# Patient Record
Sex: Female | Born: 1975 | Race: Black or African American | Hispanic: No | Marital: Married | Smoking: Current every day smoker
Health system: Southern US, Community
[De-identification: ages and names within clinical notes are randomized; demographics above are authoritative.]

## PROBLEM LIST (undated history)

## (undated) DIAGNOSIS — F419 Anxiety disorder, unspecified: Secondary | ICD-10-CM

## (undated) DIAGNOSIS — I10 Essential (primary) hypertension: Secondary | ICD-10-CM

## (undated) DIAGNOSIS — J45909 Unspecified asthma, uncomplicated: Secondary | ICD-10-CM

## (undated) DIAGNOSIS — R51 Headache: Secondary | ICD-10-CM

## (undated) DIAGNOSIS — M549 Dorsalgia, unspecified: Secondary | ICD-10-CM

## (undated) DIAGNOSIS — G5601 Carpal tunnel syndrome, right upper limb: Secondary | ICD-10-CM

## (undated) DIAGNOSIS — R519 Headache, unspecified: Secondary | ICD-10-CM

## (undated) DIAGNOSIS — J189 Pneumonia, unspecified organism: Secondary | ICD-10-CM

## (undated) DIAGNOSIS — M62838 Other muscle spasm: Secondary | ICD-10-CM

## (undated) DIAGNOSIS — N200 Calculus of kidney: Secondary | ICD-10-CM

## (undated) HISTORY — PX: HEMORRHOID SURGERY: SHX153

## (undated) HISTORY — PX: KNEE SURGERY: SHX244

---

## 2015-03-12 ENCOUNTER — Encounter (HOSPITAL_BASED_OUTPATIENT_CLINIC_OR_DEPARTMENT_OTHER): Payer: Self-pay | Admitting: Emergency Medicine

## 2015-03-12 ENCOUNTER — Emergency Department (HOSPITAL_BASED_OUTPATIENT_CLINIC_OR_DEPARTMENT_OTHER)
Admission: EM | Admit: 2015-03-12 | Discharge: 2015-03-12 | Disposition: A | Discharge status: Home / Self Care | Payer: Self-pay | Attending: Emergency Medicine | Admitting: Emergency Medicine

## 2015-03-12 DIAGNOSIS — R519 Headache, unspecified: Secondary | ICD-10-CM

## 2015-03-12 DIAGNOSIS — F17200 Nicotine dependence, unspecified, uncomplicated: Secondary | ICD-10-CM | POA: Insufficient documentation

## 2015-03-12 DIAGNOSIS — Z79899 Other long term (current) drug therapy: Secondary | ICD-10-CM | POA: Insufficient documentation

## 2015-03-12 DIAGNOSIS — R51 Headache: Secondary | ICD-10-CM | POA: Insufficient documentation

## 2015-03-12 DIAGNOSIS — J45909 Unspecified asthma, uncomplicated: Secondary | ICD-10-CM | POA: Insufficient documentation

## 2015-03-12 DIAGNOSIS — Z3202 Encounter for pregnancy test, result negative: Secondary | ICD-10-CM | POA: Insufficient documentation

## 2015-03-12 DIAGNOSIS — M62838 Other muscle spasm: Secondary | ICD-10-CM | POA: Insufficient documentation

## 2015-03-12 DIAGNOSIS — Z8679 Personal history of other diseases of the circulatory system: Secondary | ICD-10-CM | POA: Insufficient documentation

## 2015-03-12 HISTORY — DX: Unspecified asthma, uncomplicated: J45.909

## 2015-03-12 LAB — URINALYSIS, ROUTINE W REFLEX MICROSCOPIC
Bilirubin Urine: NEGATIVE
GLUCOSE, UA: NEGATIVE mg/dL
KETONES UR: NEGATIVE mg/dL
Leukocytes, UA: NEGATIVE
Nitrite: NEGATIVE
PH: 6.5 (ref 5.0–8.0)
PROTEIN: NEGATIVE mg/dL
Specific Gravity, Urine: 1.004 — ABNORMAL LOW (ref 1.005–1.030)

## 2015-03-12 LAB — PREGNANCY, URINE: Preg Test, Ur: NEGATIVE

## 2015-03-12 LAB — URINE MICROSCOPIC-ADD ON

## 2015-03-12 MED ORDER — KETOROLAC TROMETHAMINE 30 MG/ML IJ SOLN
30.0000 mg | Freq: Once | INTRAMUSCULAR | Status: AC
  Administered 2015-03-12: 30 mg via INTRAVENOUS
  Filled 2015-03-12: qty 1

## 2015-03-12 MED ORDER — DIAZEPAM 5 MG/ML IJ SOLN
10.0000 mg | Freq: Once | INTRAMUSCULAR | Status: DC
  Filled 2015-03-12: qty 2

## 2015-03-12 MED ORDER — KETOROLAC TROMETHAMINE 30 MG/ML IJ SOLN
30.0000 mg | Freq: Once | INTRAMUSCULAR | Status: DC
  Filled 2015-03-12: qty 1

## 2015-03-12 MED ORDER — KETOROLAC TROMETHAMINE 10 MG PO TABS: 10.0000 mg | ORAL_TABLET | Freq: Four times a day (QID) | ORAL | Status: DC | PRN

## 2015-03-12 MED ORDER — DIAZEPAM 5 MG PO TABS: 5.0000 mg | ORAL_TABLET | Freq: Two times a day (BID) | ORAL | Status: DC

## 2015-03-12 MED ORDER — DEXAMETHASONE SODIUM PHOSPHATE 10 MG/ML IJ SOLN
10.0000 mg | Freq: Once | INTRAMUSCULAR | Status: AC
  Administered 2015-03-12: 10 mg via INTRAVENOUS
  Filled 2015-03-12: qty 1

## 2015-03-12 MED ORDER — SODIUM CHLORIDE 0.9 % IV BOLUS (SEPSIS)
1000.0000 mL | Freq: Once | INTRAVENOUS | Status: AC
  Administered 2015-03-12: 1000 mL via INTRAVENOUS

## 2015-03-12 MED ORDER — DIAZEPAM 5 MG/ML IJ SOLN
5.0000 mg | Freq: Once | INTRAMUSCULAR | Status: AC
  Administered 2015-03-12: 5 mg via INTRAVENOUS

## 2015-03-12 NOTE — ED Notes (Signed)
Patient states that she has had a frontal Headache that radiates to her right side with ear pain and jaw pain for about 1 week. Denies any sensitive to Lights and sounds. Denies any N/V. The patient is tearful in triage. Denies any blurred vision.

## 2015-03-12 NOTE — ED Provider Notes (Signed)
CSN: [id number]     Arrival date & time 03/12/15  1650 History   First MD Initiated Contact with Patient 03/12/15 1811     Chief Complaint  Patient presents with  . Headache     (Consider location/radiation/quality/duration/timing/severity/associated sxs/prior Treatment) Patient is a 39 y.o. female presenting with headaches.  Headache Associated symptoms: neck pain    39 y.o. F with hx of asthma, presenting to the ED for headache for the past week.  She states headache is currently localized to the right side of her forehead, throbbing in nature.  She states after 2 days she began developing right sided neck pain and headache worsened.  States her neck feels "tense" and "tight".  She maintained full ROM of her neck.  Denies fever, chills, sweats, nausea, vomiting, dizziness, confusion, numbness, weakness, visual disturbance, or difficulty walking. No changes in speech. Patient is not currently on any type of anticoagulation. No history of TIA or stroke. She does have history of migraine headaches, has not had one in several years.  She has never been evaluated by neurology for her headaches.  Patient was evaluated last night at [facility] ED, husband states doctor only touched her left arm during exam and prescribed her motrin and flexeril which she has been taking without relief.  VSS.  Past Medical History  Diagnosis Date  . Asthma    History reviewed. No pertinent past surgical history. History reviewed. No pertinent family history. Social History  Substance Use Topics  . Smoking status: Current Every Day Smoker  . Smokeless tobacco: None  . Alcohol Use: No   OB History    No data available     Review of Systems  Musculoskeletal: Positive for neck pain.  Neurological: Positive for headaches.  All other systems reviewed and are negative.     Allergies  Morphine and related and Iodine  Home Medications   Prior to Admission medications   Medication Sig Start Date End  Date Taking? Authorizing Provider  albuterol (PROVENTIL HFA;VENTOLIN HFA) 108 (90 BASE) MCG/ACT inhaler Inhale into the lungs every 6 (six) hours as needed for wheezing or shortness of breath.   Yes Historical Provider, MD   BP 124/81 mmHg  Pulse 75  Temp(Src) 98.2 F (36.8 C) (Oral)  Resp 18  Ht  (1.575 m)  Wt 72.576 kg  BMI 29.26 kg/m2  SpO2 100%  LMP 03/05/2015   Physical Exam  Constitutional: She is oriented to person, place, and time. She appears well-developed and well-nourished. No distress.  tearful  HENT:  Head: Normocephalic and atraumatic.  Right Ear: Tympanic membrane and ear canal normal.  Left Ear: Tympanic membrane and ear canal normal.  Nose: Nose normal.  Mouth/Throat: Uvula is midline, oropharynx is clear and moist and mucous membranes are normal. No oropharyngeal exudate, posterior oropharyngeal edema, posterior oropharyngeal erythema or tonsillar abscesses.  No temporal tenderness  Eyes: Conjunctivae, EOM and lids are normal. Pupils are equal, round, and reactive to light.  Neck: Normal range of motion, full passive range of motion without pain and phonation normal. Neck supple. Muscular tenderness present. No spinous process tenderness present. No rigidity. No erythema and normal range of motion present.    TTP and spasm of right cervical paraspinal muscles; no rigidity; full ROM  Cardiovascular: Normal rate, regular rhythm and normal heart sounds.   Pulmonary/Chest: Effort normal and breath sounds normal. No respiratory distress. She has no wheezes. She has no rhonchi. She has no rales.  Abdominal: Soft. Bowel sounds  are normal. There is no tenderness. There is no guarding.  Musculoskeletal: Normal range of motion. She exhibits no edema.  Neurological: She is alert and oriented to person, place, and time.  AAOx3, answering questions appropriately; equal strength UE and LE bilaterally; CN grossly intact; moves all extremities appropriately without ataxia;  no focal neuro deficits or facial asymmetry appreciated  Skin: Skin is warm and dry. She is not diaphoretic.  Psychiatric: She has a normal mood and affect.  Nursing note and vitals reviewed.   ED Course  Procedures (including critical care time) Labs Review Labs Reviewed  URINALYSIS, ROUTINE W REFLEX MICROSCOPIC (NOT AT [facility]) - Abnormal; Notable for the following:    Specific Gravity, Urine 1.004 (*)    Hgb urine dipstick TRACE (*)    All other components within normal limits  URINE MICROSCOPIC-ADD ON - Abnormal; Notable for the following:    Squamous Epithelial / LPF 0-5 (*)    Bacteria, UA MANY (*)    All other components within normal limits  URINE CULTURE  PREGNANCY, URINE    Imaging Review No results found. I have personally reviewed and evaluated these images and lab results as part of my medical decision-making.   EKG Interpretation None      MDM   Final diagnoses:  Headache, unspecified headache type  Muscle spasms of neck   39 y.o. F here with headache and right sided neck pain.  Seen last night in [place] and given motrin and flexeril which she has taken without relief.  Patient afebrile, non-toxic.  She has muscle spasm of her right cervical paraspinal muscles are tender to palpation. She has no nuchal rigidity. Neurologic exam is nonfocal.  Patient does have history of migraines, states she has not had one in several years. Given her presentation today, feel this is more likely tension-type headache. Patient was treated with toradol, Valium, and Decadron with resolution of headache. She did have a slight recurrence of headache while in the ED, but states it was only 3/10.  She was again treated with Toradol with improvement. She request discharge home with same medications given here which I have written for. She was encouraged to follow-up with primary care physician.  Of note, u/a with many bacteria, however patient is asymptomatic of this.  Will hold abx  treatment pending urine culture.  Discussed plan with patient, he/she acknowledged understanding and agreed with plan of care.  Return precautions given for new or worsening symptoms.  Garlon HatchetLisa M Alan Riles, PA-C 03/12/15 2135  Rolan BuccoMelanie Belfi, MD 03/12/15 2249

## 2015-03-12 NOTE — ED Notes (Signed)
HA onset last week, progressively worse over time, went to [place] last PM with some complaint, 4 ibuprofen at noon today. Took a muscle relaxant approx 1 hour ago.

## 2015-03-12 NOTE — ED Notes (Signed)
Denies any nausea or vomiting 

## 2015-03-12 NOTE — ED Notes (Signed)
Pain is more localized to rt side of head above rt eye, radiates to rt side of neck

## 2015-03-12 NOTE — Discharge Instructions (Signed)
Take the prescribed medication as directed.  May take tylenol between doses if needed. Follow-up with your primary care physician as soon as possible. Return to the ED for new or worsening symptoms.   Tension Headache A tension headache is a feeling of pain, pressure, or aching that is often felt over the front and sides of the head. The pain can be dull, or it can feel tight (constricting). Tension headaches are not normally associated with nausea or vomiting, and they do not get worse with physical activity. Tension headaches can last from 30 minutes to several days. This is the most common type of headache. CAUSES The exact cause of this condition is not known. Tension headaches often begin after stress, anxiety, or depression. Other triggers may include:  Alcohol.  Too much caffeine, or caffeine withdrawal.  Respiratory infections, such as colds, flu, or sinus infections.  Dental problems or teeth clenching.  Fatigue  Holding your head and neck in the same position for a long period of time, such as while using a computer.  Smoking. SYMPTOMS Symptoms of this condition include:  A feeling of pressure around the head.  Dull, aching head pain.  Pain felt over the front and sides of the head.  Tenderness in the muscles of the head, neck, and shoulders. DIAGNOSIS This condition may be diagnosed based on your symptoms and a physical exam. Tests may be done, such as a CT scan or an MRI of your head. These tests may be done if your symptoms are severe or unusual. TREATMENT This condition may be treated with lifestyle changes and medicines to help relieve symptoms. HOME CARE INSTRUCTIONS Managing Pain  Take over-the-counter and prescription medicines only as told by your health care provider.  Lie down in a dark, quiet room when you have a headache.  If directed, apply ice to the head and neck area:  Put ice in a plastic bag.  Place a towel between your skin and the  bag.  Leave the ice on for 20 minutes, 2-3 times per day.  Use a heating pad or a hot shower to apply heat to the head and neck area as told by your health care provider. Eating and Drinking  Eat meals on a regular schedule.  Limit alcohol use.  Decrease your caffeine intake, or stop using caffeine. General Instructions  Keep all follow-up visits as told by your health care provider. This is important.  Keep a headache journal to help find out what may trigger your headaches. For example, write down:  What you eat and drink.  How much sleep you get.  Any change to your diet or medicines.  Try massage or other relaxation techniques.  Limit stress.  Sit up straight, and avoid tensing your muscles.  Do not use tobacco products, including cigarettes, chewing tobacco, or e-cigarettes. If you need help quitting, ask your health care provider.  Exercise regularly as told by your health care provider.  Get 7-9 hours of sleep, or the amount recommended by your health care provider. SEEK MEDICAL CARE IF:  Your symptoms are not helped by medicine.  You have a headache that is different from what you normally experience.  You have nausea or you vomit.  You have a fever. SEEK IMMEDIATE MEDICAL CARE IF:  Your headache becomes severe.  You have repeated vomiting.  You have a stiff neck.  You have a loss of vision.  You have problems with speech.  You have pain in your eye or  ear.  You have muscular weakness or loss of muscle control.  You lose your balance or you have trouble walking.  You feel faint or you pass out.  You have confusion.   This information is not intended to replace advice given to you by your health care provider. Make sure you discuss any questions you have with your health care provider.   Document Released: 03/12/2005 Document Revised: 12/01/2014 Document Reviewed: 07/05/2014 Elsevier Interactive Patient Education [employer]

## 2015-03-12 NOTE — ED Notes (Signed)
Informed PA-C of pts complaint and presentation, PA-C in to see pt

## 2015-03-12 NOTE — ED Notes (Signed)
Pt placed on cont POX monitoring with q6615min NBP assessments, callbell within reach, sr x 2 up, enviornmental changes made to promote comfort, pt reassured.

## 2015-03-14 LAB — URINE CULTURE

## 2015-03-15 ENCOUNTER — Telehealth (HOSPITAL_BASED_OUTPATIENT_CLINIC_OR_DEPARTMENT_OTHER): Payer: Self-pay | Admitting: Emergency Medicine

## 2015-03-15 NOTE — Telephone Encounter (Signed)
Post ED Visit - Positive Culture Follow-up  Culture report reviewed by antimicrobial stewardship pharmacist:  [x]  Terri Rios, Pharm.D. []  Terri Rios, Pharm.D., BCPS []  Terri Rios, Pharm.D. []  Terri Rios, Pharm.D., BCPS []  [place] Valley[place]h Rios, [address]Pharm[address]D., BCPS, AAHIVP []  Estella HuskMichelle Rios, Pharm.D., BCPS, AAHIVP []  [name], Pharm.D. []  Rob Terri Rios, [address]Ph[address]  Positive urine culture Klebsiella Treated with none, asymptomatic, and no further patient follow-up is required at this time.  Terri Rios, Terri Rios 03/15/2015, 9:29 AM

## 2015-03-15 NOTE — Progress Notes (Signed)
ED Antimicrobial Stewardship Positive Culture Follow Up   Collene MaresYolanda Berne is an 39 y.o. female who [facility] with a chief complaint of  Chief Complaint  Patient presents with  . Headache    Recent Results (from the past 720 hour(s))  Urine culture     Status: None   Collection Time: 03/12/15  5:53 PM  Result Value Ref Range Status   Specimen Description URINE, CLEAN CATCH  Final   Special Requests NONE  Final   Culture   Final    >=100,000 COLONIES/mL KLEBSIELLA PNEUMONIAE [facility]    Report Status 03/14/2015 FINAL  Final   Organism ID, Bacteria KLEBSIELLA PNEUMONIAE  Final      Susceptibility   Klebsiella pneumoniae - MIC*    AMPICILLIN >=32 RESISTANT Resistant     CEFAZOLIN <=4 SENSITIVE Sensitive     CEFTRIAXONE <=1 SENSITIVE Sensitive     CIPROFLOXACIN <=0.25 SENSITIVE Sensitive     GENTAMICIN <=1 SENSITIVE Sensitive     IMIPENEM <=0.25 SENSITIVE Sensitive     NITROFURANTOIN 64 INTERMEDIATE Intermediate     TRIMETH/SULFA <=20 SENSITIVE Sensitive     AMPICILLIN/SULBACTAM 4 SENSITIVE Sensitive     PIP/TAZO <=4 SENSITIVE Sensitive     * >=100,000 COLONIES/mL KLEBSIELLA PNEUMONIAE    Asymptomatic bacteruria  Plan: No treatment   ED Provider: Fuller SongSerena Sam PA-C   Justyna Timoney J 03/15/2015, 8:56 AM Infectious Diseases Pharmacist Phone# [phone]

## 2015-03-29 ENCOUNTER — Encounter (HOSPITAL_BASED_OUTPATIENT_CLINIC_OR_DEPARTMENT_OTHER): Payer: Self-pay | Admitting: *Deleted

## 2015-03-29 ENCOUNTER — Emergency Department (HOSPITAL_BASED_OUTPATIENT_CLINIC_OR_DEPARTMENT_OTHER): Payer: Self-pay

## 2015-03-29 ENCOUNTER — Emergency Department (HOSPITAL_BASED_OUTPATIENT_CLINIC_OR_DEPARTMENT_OTHER)
Admission: EM | Admit: 2015-03-29 | Discharge: 2015-03-29 | Disposition: A | Discharge status: Home / Self Care | Payer: Self-pay | Attending: Emergency Medicine | Admitting: Emergency Medicine

## 2015-03-29 DIAGNOSIS — Z3202 Encounter for pregnancy test, result negative: Secondary | ICD-10-CM | POA: Insufficient documentation

## 2015-03-29 DIAGNOSIS — K047 Periapical abscess without sinus: Secondary | ICD-10-CM | POA: Insufficient documentation

## 2015-03-29 DIAGNOSIS — F17200 Nicotine dependence, unspecified, uncomplicated: Secondary | ICD-10-CM | POA: Insufficient documentation

## 2015-03-29 DIAGNOSIS — M25511 Pain in right shoulder: Secondary | ICD-10-CM | POA: Insufficient documentation

## 2015-03-29 DIAGNOSIS — K029 Dental caries, unspecified: Secondary | ICD-10-CM | POA: Insufficient documentation

## 2015-03-29 DIAGNOSIS — M62838 Other muscle spasm: Secondary | ICD-10-CM | POA: Insufficient documentation

## 2015-03-29 DIAGNOSIS — J45909 Unspecified asthma, uncomplicated: Secondary | ICD-10-CM | POA: Insufficient documentation

## 2015-03-29 DIAGNOSIS — Z79899 Other long term (current) drug therapy: Secondary | ICD-10-CM | POA: Insufficient documentation

## 2015-03-29 DIAGNOSIS — H9209 Otalgia, unspecified ear: Secondary | ICD-10-CM | POA: Insufficient documentation

## 2015-03-29 LAB — PREGNANCY, URINE: Preg Test, Ur: NEGATIVE

## 2015-03-29 MED ORDER — DIPHENHYDRAMINE HCL 50 MG/ML IJ SOLN
25.0000 mg | Freq: Once | INTRAMUSCULAR | Status: AC
Start: 2015-03-29 — End: 2015-03-29
  Administered 2015-03-29: 25 mg via INTRAVENOUS
  Filled 2015-03-29: qty 1

## 2015-03-29 MED ORDER — PROCHLORPERAZINE EDISYLATE 5 MG/ML IJ SOLN
10.0000 mg | Freq: Four times a day (QID) | INTRAMUSCULAR | Status: DC | PRN
  Administered 2015-03-29: 10 mg via INTRAVENOUS
  Filled 2015-03-29: qty 2

## 2015-03-29 MED ORDER — AMOXICILLIN-POT CLAVULANATE 875-125 MG PO TABS: 1.0000 | ORAL_TABLET | Freq: Two times a day (BID) | ORAL | Status: DC

## 2015-03-29 MED ORDER — DIAZEPAM 5 MG/ML IJ SOLN
5.0000 mg | Freq: Once | INTRAMUSCULAR | Status: AC
  Administered 2015-03-29: 5 mg via INTRAVENOUS
  Filled 2015-03-29: qty 2

## 2015-03-29 MED ORDER — DIAZEPAM 5 MG PO TABS: 5.0000 mg | ORAL_TABLET | Freq: Two times a day (BID) | ORAL | Status: DC | PRN

## 2015-03-29 MED FILL — diazePAM 5 MG TABS: 5 | 5 days supply | Qty: 10 | Fill #0

## 2015-03-29 MED FILL — AMOX-CLAV 875-125 MG TABLET: 875-125 | 7 days supply | Qty: 14 | Fill #0

## 2015-03-29 NOTE — Discharge Instructions (Signed)
Dental Abscess °A dental abscess is a collection of pus in or around a tooth. °CAUSES °This condition is caused by a bacterial infection around the root of the tooth that involves the inner part of the tooth (pulp). It may result from: °· Severe tooth decay. °· Trauma to the tooth that allows bacteria to enter into the pulp, such as a broken or chipped tooth. °· Severe gum disease around a tooth. °SYMPTOMS °Symptoms of this condition include: °· Severe pain in and around the infected tooth. °· Swelling and redness around the infected tooth, in the mouth, or in the face. °· Tenderness. °· Pus drainage. °· Bad breath. °· Bitter taste in the mouth. °· Difficulty swallowing. °· Difficulty opening the mouth. °· Nausea. °· Vomiting. °· Chills. °· Swollen neck glands. °· Fever. °DIAGNOSIS °This condition is diagnosed with examination of the infected tooth. During the exam, your dentist may tap on the infected tooth. Your dentist will also ask about your medical and dental history and may order X-rays. °TREATMENT °This condition is treated by eliminating the infection. This may be done with: °· Antibiotic medicine. °· A root canal. This may be performed to save the tooth. °· Pulling (extracting) the tooth. This may also involve draining the abscess. This is done if the tooth cannot be saved. °HOME CARE INSTRUCTIONS °· Take medicines only as directed by your dentist. °· If you were prescribed antibiotic medicine, finish all of it even if you start to feel better. °· Rinse your mouth (gargle) often with salt water to relieve pain or swelling. °· Do not drive or operate heavy machinery while taking pain medicine. °· Do not apply heat to the outside of your mouth. °· Keep all follow-up visits as directed by your dentist. This is important. °SEEK MEDICAL CARE IF: °· Your pain is worse and is not helped by medicine. °SEEK IMMEDIATE MEDICAL CARE IF: °· You have a fever or chills. °· Your symptoms suddenly get worse. °· You have a  very bad headache. °· You have problems breathing or swallowing. °· You have trouble opening your mouth. °· You have swelling in your neck or around your eye. °  °This information is not intended to replace advice given to you by your health care provider. Make sure you discuss any questions you have with your health care provider. °  °Document Released: 03/12/2005 Document Revised: 07/27/2014 Document Reviewed: 03/09/2014 °Elsevier Interactive Patient Education ©2016 Elsevier Inc. ° °Emergency Department Resource Guide °1) Find a Doctor and Pay Out of Pocket °Although you won't have to find out who is covered by your insurance plan, it is a good idea to ask around and get recommendations. You will then need to call the office and see if the doctor you have chosen will accept you as a new patient and what types of options they offer for patients who are self-pay. Some doctors offer discounts or will set up payment plans for their patients who do not have insurance, but you will need to ask so you aren't surprised when you get to your appointment. ° °2) Contact Your Local Health Department °Not all health departments have doctors that can see patients for sick visits, but many do, so it is worth a call to see if yours does. If you don't know where your local health department is, you can check in your phone book. The CDC also has a tool to help you locate your state's health department, and many state websites also have listings   of all of their local health departments. ° °3) Find a Walk-in Clinic °If your illness is not likely to be very severe or complicated, you may want to try a walk in clinic. These are popping up all over the country in pharmacies, drugstores, and shopping centers. They're usually staffed by nurse practitioners or physician assistants that have been trained to treat common illnesses and complaints. They're usually fairly quick and inexpensive. However, if you have serious medical issues or  chronic medical problems, these are probably not your best option. ° °No Primary Care Doctor: °- Call [facility] at  832-8000 - they can help you locate a primary care doctor that  accepts your insurance, provides certain services, etc. °- Physician Referral Service- 1-800-533-3463 ° °Chronic Pain Problems: °Organization         Address  Phone   Notes  ° [facility]  (336) 297-2271 Patients need to be referred by their primary care doctor.  ° °Medication Assistance: °Organization         Address  Phone   Notes  °[place] [facility] [address] °[place], [place] [address] (336) 641-8030 --Must be a resident of [place] °-- Must have NO insurance coverage whatsoever (no Medicaid/ Medicare, etc.) °-- The pt. MUST have a primary care doctor that directs their care regularly and follows them in the community °  °[facility]  (866) 331-1348   °[employer]  (888) 892-1162   ° °Agencies that provide inexpensive medical care: °Organization         Address  Phone   Notes  °[facility]  (336) 832-8035   °[facility]    (336) 832-7272   °[facility] [address] °[place], [place] [address] (336) 832-4777   °[facility] [address], °[place] (336) 271-4999   °[facility]    (336) 373-0678   °[facility]    (336) 272-1050   °[facility] ° [address], [place] Phone:  (336) 832-4444, Fax:  (336) 832-4440 Hours of Operation:  9 am - 6 pm, M-F.  Also accepts Medicaid/Medicare and self-pay.  °[facility] ° [address], [place] Phone: (336) 832-3150, Fax: (336) 832-3151. Hours of Operation:  8:30 am - 5:30 pm, M-F.  Also accepts Medicaid and self-pay.  °[facility] [place] [address], [place] Phone: (336) 878-6027   °[facility] [address], [place],   (336)723-1848, Ext. 123 Mondays & Thursdays: 7-9 AM.  First 15 patients are seen on a first come, first serve basis. °  ° °Medicaid-accepting [place] Providers: ° °Organization         Address  Phone   Notes  °[facility] [address], [place] (336) 641-2100 Also accepts self-pay patients.  °[facility] [address], [place] ° (336) 856-9996   °[facility] [address], [place] (336) 288-8857   °[facility] [address] [place] [address],  (336) 299-7000   °Ve[facility] [address],   ° (336) 373-1557 Only accepts [place] Access Medicaid patients after they have their name applied to their card.  ° °Self-Pay (no insurance) in [place]: ° °Organization         Address  Phone   Notes  °  Sickle Cell Patients, [facility] [address], [place] (336) 832-1970   °[facility] [address], [place] (336) 832-4400   °[facility] ° [address],  (336) 992-4800   °[facility]/Dr. Osei-Bonsu ° [address] [place] [address], [place] or [address], [place] (336) 841-8500 Phone number for both [place] and [place] locations is the same.  °[facility] [address], [place] (336) 299-0000   °[facility] [address] [place] [address], [place] or [address] (336) 852-7530 °(336) 878-2260   °[facility] [address], [place] (336) 350-1642, phone; (336) 294-5005, fax Sees patients 1st and 3rd Saturday of every month.  Must not qualify for public or private insurance (i.e. Medicaid, Medicare, [place] Health Choice, Veterans' Benefits) • Household income should be no more than 200% of the poverty level •The clinic cannot treat you if you are pregnant or think you are pregnant • Sexually transmitted  diseases are not treated at the clinic.  ° ° °Dental Care: °Organization         Address  Phone  Notes  °[facility] [address], [place] (336) 641-6152 Accepts children up to age 21 who are enrolled in Medicaid or [place] Health Choice; pregnant women with a Medicaid card; and children who have applied for Medicaid or [place] Health Choice, but were declined, whose parents can pay a reduced fee at time of service.  °[facility] [place]  [address], [place] (336) 641-7733 Accepts children up to age 21 who are enrolled in Medicaid or [place] Health Choice; pregnant women with a Medicaid card; and children who have applied for Medicaid or [place] Health Choice, but were declined, whose parents can pay a reduced fee at time of service.  °[facility] ° [address],  (336) 641-4533 Patients are seen by appointment only. Walk-ins are not accepted. [facility] will see patients 18 years of age and older. °Monday - Tuesday (8am-5pm) °Most Wednesdays (8:30-5pm) °$30 per visit, cash only  °[facility] ° [address], [place] (336) 641-4533 Patients are seen by appointment only. Walk-ins are not accepted. [facility] will see patients 18 years of age and older. °One Wednesday Evening (Monthly: Volunteer Based).  $30 per visit, cash only  °[facility]  (919) 537-3737 for adults; Children under age 4, call Graduate Pediatric Dentistry at ([phone]) [phone]. Children aged 4-14, please call (919) [phone] to request a pediatric application. ° Dental services are provided in all areas of dental care including fillings, crowns and bridges, complete and partial dentures, implants, gum treatment, root canals, and extractions. Preventive care is also provided. Treatment is provided to both adults and children. °Patients are selected via a  lottery and there is often a waiting list. °  °[facility] [address], ° ° (336) 763-8833 [email or web address] °  °[facility] [address], [place], [place] (336)723-1848, Ext. 123 Second and Fourth Thursday of each month, opens at 6:30 AM; Clinic ends at 9 AM.  Patients are seen on a first-come first-served basis, and a limited number are seen during each clinic.  ° °[facility] ° [address], [place], [place] (336) 723-7904   Eligibility   Requirements °You must have lived in [place], or [place] counties for at least the last three months. °  You cannot be eligible for state or federal sponsored healthcare insurance, including Veterans Administration, Medicaid, or Medicare. °  You generally cannot be eligible for healthcare insurance through your employer.  °  How to apply: °Eligibility screenings are held every Tuesday and Wednesday afternoon from 1:00 pm until 4:00 pm. You do not need an appointment for the interview!  °[facility] [address], [place], [place] 336-631-2330   °[facility]  336-342-8273   °[facility]  336-703-3100   °[facility]  336-570-6415   ° °Behavioral Health Resources in the Community: °Intensive Outpatient Programs °Organization         Address  Phone  Notes  °[facility] [address], [place], [place] 336-878-6098   °[facility] [address], [place], [place] 336-832-9800   °[facility] [address], [place], [place] ° 336-882-2125   °[facility] [address],  °[place], [place] 1-800-853-5163 or 336-641-4981   °Substance Abuse Resources °Organization         Address  Phone  Notes  °[facility]  336-882-2125   °[facility]  336-784-9470   °[facility]  336-285-9073   °[facility]  336-845-3988   °Residential &  Outpatient Substance Abuse Program  1-800-659-3381   °Psychological Services °Organization         Address  Phone  Notes  °[facility]  336- 832-9600   °[facility]  336- 378-7881   °[facility] [address], [place] 1-800-853-5163 or 336-641-4981   ° °Mobile Crisis Teams °Organization         Address  Phone  Notes  °[facility], Mobile Crisis Care Unit  1-877-626-1772   °Assertive °[facility] ° [address] [place], [place] 336-834-9664   °Sharon DeEsch [address] °[place] 336-554-5454   ° °Self-Help/Support Groups °Organization         Address  Phone             Notes  °[facility] [place] - variety of support groups  336- 373-1402 Call for more information  °Narcotics Anonymous (NA), [facility] [address], °[place]  2 meetings at this location  ° °Residential Treatment Programs °Organization         Address  Phone  Notes  °[facility] [address],    °[place]  1-866-801-8205   °[facility] ° [address], [place], [place] 704-293-8524   °[facility] [address], [place] 336-845-3988 Admissions: 8am-3pm M-F  °[facility] [address]    °[place], [place] 336-841-1104   °[facility] [address], [place], [place] 336-379-7146   °[facility] [address]  °[place], [place] 336-285-9073   °[facility] Intensive Outpatient [address], [place], [place] 336-852-3033   °[facility] [address]  °[place], [place] 1-877-615-2722 or 336-784-9470   °[facility] [address] [place], [place] 336-227-7417 Accepts Medicaid  °[facility] [address]  °[place]  1-800-659-3381 Substance Abuse/Addiction Treatment  ° °[place] Behavioral Health Resources °Organization            Address  Phone  Notes  °[facility]  (888) 581-9988   °Julie Brannon, PhD [address] [place], [place]   (336) 349-5553 or (336) 951-0000   °[facility]   [address] °[place], [place] (336) 349-4454   °[facility] [address], [place], [place] (336) 342-8316 Insurance/Medicaid/sponsorship through [facility]  °[facility] [address]                                    [place], [place] (336) 342-8316 Therapy/tele-psych/case  °[facility] [address]  ° [place], [place] (336) 349-2233    °Dr. Arfeen  (336) 349-4544   °[facility] [place]  [employer] [place] [facility] 1) [address], [place] °2) [address], [place] °3)  [address], [place] (336) 349-3220 °(336) 342-7768 ° °(336) 342-8140   °[place] [facility] (336) 342-1394 or (336) 342-3537 (After Hours)    ° ° °

## 2015-03-29 NOTE — ED Provider Notes (Signed)
CSN: [id number]     Arrival date & time 03/29/15  [id number] History   First MD Initiated Contact with Patient 03/29/15 [id number]     Chief Complaint  Patient presents with  . Headache     (Consider location/radiation/quality/duration/timing/severity/associated sxs/prior Treatment) Patient is a 40 y.o. female presenting with headaches.  Headache Pain location:  R temporal and R parietal Quality:  Sharp Radiates to:  R neck and R shoulder (right side of head) Severity currently:  10/10 Severity at highest:  10/10 Onset quality:  Gradual Duration:  5 days Timing:  Constant Progression:  Worsening Chronicity:  New Similar to prior headaches: yes (had episode prior 12/17)   Relieved by:  Nothing Worsened by:  Light and neck movement Ineffective treatments:  Acetaminophen (heating pad, muscle relaxant) Associated symptoms: ear pain and neck pain   Associated symptoms: no abdominal pain, no back pain, no blurred vision, no cough, no fatigue, no fever, no focal weakness, no nausea, no near-syncope, no seizures, no sore throat, no syncope, no URI, no vomiting and no weakness   Associated symptoms comment:  Jaw pain Risk factors: no family hx of SAH     Past Medical History  Diagnosis Date  . Asthma    History reviewed. No pertinent past surgical history. History reviewed. No pertinent family history. Social History  Substance Use Topics  . Smoking status: Current Every Day Smoker  . Smokeless tobacco: None  . Alcohol Use: No   OB History    No data available     Review of Systems  Constitutional: Negative for fever and fatigue.  HENT: Positive for ear pain. Negative for sore throat.   Eyes: Negative for blurred vision and visual disturbance.  Respiratory: Negative for cough and shortness of breath.   Cardiovascular: Negative for chest pain, syncope and near-syncope.  Gastrointestinal: Negative for nausea, vomiting and abdominal pain.  Genitourinary: Negative for difficulty  urinating.  Musculoskeletal: Positive for neck pain. Negative for back pain.  Skin: Negative for rash.  Neurological: Positive for headaches. Negative for focal weakness, seizures, syncope and weakness.      Allergies  Morphine and related and Iodine  Home Medications   Prior to Admission medications   Medication Sig Start Date End Date Taking? Authorizing Provider  albuterol (PROVENTIL HFA;VENTOLIN HFA) 108 (90 BASE) MCG/ACT inhaler Inhale into the lungs every 6 (six) hours as needed for wheezing or shortness of breath.    Historical Provider, MD  amoxicillin-clavulanate (AUGMENTIN) 875-125 MG tablet Take 1 tablet by mouth every 12 (twelve) hours. 03/29/15 04/05/15  Alvira MondayErin Inri Sobieski, MD  diazepam (VALIUM) 5 MG tablet Take 1 tablet (5 mg total) by mouth every 12 (twelve) hours as needed for anxiety or muscle spasms. 03/29/15   Alvira MondayErin Tamsen Reist, MD   BP 120/84 mmHg  Pulse 74  Temp(Src) 98.8 F (37.1 C) (Oral)  Resp 16  SpO2 100%  LMP 03/28/2015 Physical Exam  Constitutional: She is oriented to person, place, and time. She appears well-developed and well-nourished. No distress.  HENT:  Head: Normocephalic and atraumatic.  Mouth/Throat: Mucous membranes are normal. Mucous membranes are not dry. No oral lesions. There is trismus (mild) in the jaw. Dental caries present. No tonsillar abscesses.  Eyes: Conjunctivae and EOM are normal.  Neck: Normal range of motion.  No neck swelling +tenderness and spasm of right trapezius  Cardiovascular: Normal rate, regular rhythm, normal heart sounds and intact distal pulses.  Exam reveals no gallop and no friction rub.   No murmur heard.  Pulmonary/Chest: Effort normal and breath sounds normal. No respiratory distress. She has no wheezes. She has no rales.  Abdominal: Soft. She exhibits no distension. There is no tenderness. There is no guarding.  Musculoskeletal: She exhibits no edema or tenderness.  Neurological: She is alert and oriented to person,  place, and time. She has normal strength. No cranial nerve deficit or sensory deficit. She displays a negative Romberg sign. Coordination normal. GCS eye subscore is 4. GCS verbal subscore is 5. GCS motor subscore is 6.  Skin: Skin is warm and dry. No rash noted. She is not diaphoretic. No erythema.  Nursing note and vitals reviewed.   ED Course  Procedures (including critical care time) Labs Review Labs Reviewed  PREGNANCY, URINE    Imaging Review Ct Head Wo Contrast  03/29/2015  CLINICAL DATA:  Headache with tenderness over right mastoid region. EXAM: CT HEAD WITHOUT CONTRAST TECHNIQUE: Contiguous axial images were obtained from the base of the skull through the vertex without intravenous contrast. COMPARISON:  None. FINDINGS: The ventricles are normal in size and configuration. There does appear to be atrophy in the high frontal lobe regions bilaterally. There is no intracranial mass, hemorrhage, extra-axial fluid collection, or midline shift. The gray-white compartments are normal. Bony calvarium appears intact. The mastoids on the left are clear. 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 No intraorbital lesions are identified. There is mild mucosal thickening in the anterior ethmoid air cells bilaterally. IMPRESSION: High frontal atrophy bilaterally. Ventricles are normal in size and configuration. No intracranial mass, hemorrhage, focal gray - white compartment lesion. 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 There is mucosal thickening in several anterior ethmoid air cells bilaterally. Mastoids on the left are clear. Electronically Signed   By: Bretta Bang III M.D.   On: 03/29/2015 09:55   Ct Soft Tissue Neck Wo Contrast  03/29/2015  CLINICAL DATA:  Right-sided head and neck pain. Jaw pain with trismus. EXAM: CT NECK WITHOUT CONTRAST TECHNIQUE: Multidetector CT imaging of the neck was performed following the standard protocol without intravenous contrast. Patient has history of  contrast allergy. A cauda COMPARISON:  None. FINDINGS: Evaluation of the soft tissues is suboptimal in the absence of IV contrast. There is significant periodontal disease affecting the mandibular teeth. Multiple teeth are missing, presumably extracted for periodontal disease. There is an oral cavity abscess adjacent to the medial angle of mandible on the RIGHT, 8 x 11 mm cross-section with a small bubble of air. New new see for instance image 44 series 4. No definite extension to the submandibular or sublingual space but characterization is made difficult in the absence of IV contrast. Reactive BILATERAL RIGHT greater than LEFT level 1 and level 2 lymph nodes. No mandibular osteomyelitis is detected. No acute sinus disease. Negative orbits. Airway midline. Negative visualized intracranial compartment. No lung apex lesion.  No neck masses. IMPRESSION: BILATERAL mandibular periodontal disease with a suspected RIGHT oral cavity abscess 8 x 11 mm cross-section, adjacent to the medial angle of mandible. Reactive cervical adenopathy. Within limits for assessment on noncontrast exam, no definite extension to the submandibular or sublingual space on the RIGHT. Recommend correlation with physical exam. Oral surgical or ENT consultation may be warranted. Electronically Signed   By: Elsie Stain M.D.   On: 03/29/2015 10:08   I have personally reviewed and evaluated these images and lab results as part of my medical decision-making.   EKG Interpretation None      MDM   Final diagnoses:  Periapical abscess, right cavity abscess near angle of mandible  Muscle spasms of neck   46 old female with history of asthma presents with severe right jaw/neck pain and right headache.  CT obtained (noncontrast given iodine allergy) showing right oral cavity abscess 8 x 11 mm adjacent to the medial angle of the mandible. There is no sign of sublingual swelling on exam and doubt Ludwig's angina.  Patient has tenderness over the  muscles on the right side of her neck, however does not have any significant right neck swelling and at this time doubt Lemierre's syndrome.  Patient likely with both muscular spasm and pain from dental abscess.  Gave prescription for Augmentin, and Valium for muscle spasm. Patient received headache cocktail and Valium in the emergency department with improvement of pain.  Discussed reasons to return to the emergency department in detail, including oral swelling, fevers, swelling the right side of the neck or other concerns. Patient discharged in stable condition with understanding of reasons to return.   Alvira Monday, MD 03/29/15 [phone]

## 2015-03-29 NOTE — ED Notes (Signed)
Pt amb to room 7 with slow steady gait, crying. Pt reports right sided head "pressure" around her left eye x 6 days. Pt states she has been seen here for same, and was given headache cocktail with relief.

## 2015-03-30 ENCOUNTER — Encounter (HOSPITAL_BASED_OUTPATIENT_CLINIC_OR_DEPARTMENT_OTHER): Payer: Self-pay | Admitting: Emergency Medicine

## 2015-03-30 ENCOUNTER — Inpatient Hospital Stay (HOSPITAL_BASED_OUTPATIENT_CLINIC_OR_DEPARTMENT_OTHER)
Admission: EM | Admit: 2015-03-30 | Discharge: 2015-04-01 | DRG: 158 | Disposition: A | Discharge status: Home / Self Care | Payer: BLUE CROSS/BLUE SHIELD | Attending: Internal Medicine | Admitting: Internal Medicine

## 2015-03-30 DIAGNOSIS — K045 Chronic apical periodontitis: Secondary | ICD-10-CM | POA: Diagnosis present

## 2015-03-30 DIAGNOSIS — K122 Cellulitis and abscess of mouth: Secondary | ICD-10-CM | POA: Diagnosis not present

## 2015-03-30 DIAGNOSIS — F17200 Nicotine dependence, unspecified, uncomplicated: Secondary | ICD-10-CM | POA: Diagnosis present

## 2015-03-30 DIAGNOSIS — K0530 Chronic periodontitis, unspecified: Secondary | ICD-10-CM | POA: Diagnosis present

## 2015-03-30 DIAGNOSIS — J45909 Unspecified asthma, uncomplicated: Secondary | ICD-10-CM | POA: Diagnosis present

## 2015-03-30 DIAGNOSIS — K0889 Other specified disorders of teeth and supporting structures: Secondary | ICD-10-CM

## 2015-03-30 DIAGNOSIS — K0401 Reversible pulpitis: Secondary | ICD-10-CM | POA: Diagnosis present

## 2015-03-30 DIAGNOSIS — Z79899 Other long term (current) drug therapy: Secondary | ICD-10-CM

## 2015-03-30 DIAGNOSIS — M542 Cervicalgia: Secondary | ICD-10-CM | POA: Diagnosis present

## 2015-03-30 DIAGNOSIS — M264 Malocclusion, unspecified: Secondary | ICD-10-CM | POA: Diagnosis present

## 2015-03-30 DIAGNOSIS — K029 Dental caries, unspecified: Secondary | ICD-10-CM | POA: Diagnosis present

## 2015-03-30 DIAGNOSIS — K060 Gingival recession: Secondary | ICD-10-CM | POA: Diagnosis present

## 2015-03-30 DIAGNOSIS — K036 Deposits [accretions] on teeth: Secondary | ICD-10-CM | POA: Diagnosis present

## 2015-03-30 LAB — BASIC METABOLIC PANEL WITH GFR
Anion gap: 6 (ref 5–15)
BUN: 9 mg/dL (ref 6–20)
CO2: 24 mmol/L (ref 22–32)
Calcium: 9.1 mg/dL (ref 8.9–10.3)
Chloride: 106 mmol/L (ref 101–111)
Creatinine, Ser: 0.7 mg/dL (ref 0.44–1.00)
GFR calc Af Amer: >60 mL/min
GFR calc non Af Amer: >60 mL/min
Glucose, Bld: 111 mg/dL — ABNORMAL HIGH (ref 65–99)
Potassium: 3.7 mmol/L (ref 3.5–5.1)
Sodium: 136 mmol/L (ref 135–145)

## 2015-03-30 LAB — CBC WITH DIFFERENTIAL/PLATELET
Basophils Absolute: 0 K/uL (ref 0.0–0.1)
Basophils Relative: 0 %
Eosinophils Absolute: 0.3 K/uL (ref 0.0–0.7)
Eosinophils Relative: 4 %
HCT: 37.4 % (ref 36.0–46.0)
Hemoglobin: 12.5 g/dL (ref 12.0–15.0)
Lymphocytes Relative: 33 %
Lymphs Abs: 2.3 K/uL (ref 0.7–4.0)
MCH: 30.1 pg (ref 26.0–34.0)
MCHC: 33.4 g/dL (ref 30.0–36.0)
MCV: 90.1 fL (ref 78.0–100.0)
Monocytes Absolute: 0.6 K/uL (ref 0.1–1.0)
Monocytes Relative: 9 %
Neutro Abs: 3.8 K/uL (ref 1.7–7.7)
Neutrophils Relative %: 54 %
Platelets: 309 K/uL (ref 150–400)
RBC: 4.15 MIL/uL (ref 3.87–5.11)
RDW: 12.7 % (ref 11.5–15.5)
WBC: 7.1 K/uL (ref 4.0–10.5)

## 2015-03-30 MED ORDER — HYDROMORPHONE HCL 1 MG/ML IJ SOLN
1.0000 mg | Freq: Once | INTRAMUSCULAR | Status: AC
  Administered 2015-03-30: 1 mg via INTRAVENOUS
  Filled 2015-03-30: qty 1

## 2015-03-30 MED ORDER — DIAZEPAM 5 MG/ML IJ SOLN
5.0000 mg | Freq: Once | INTRAMUSCULAR | Status: AC
  Administered 2015-03-30: 5 mg via INTRAVENOUS
  Filled 2015-03-30: qty 2

## 2015-03-30 NOTE — ED Notes (Signed)
C/o rt jaw pain x 2 weeks  Was seen here yesterday for same

## 2015-03-30 NOTE — Progress Notes (Signed)
40 year old F previously healthy presents with neck pain of several months duration.  Here today with severe controlled pain.    Admit to med surg for observation and pain control.

## 2015-03-30 NOTE — ED Notes (Signed)
Patient was here yesterday for the same. The patient reports that she is still in pain

## 2015-03-30 NOTE — ED Provider Notes (Signed)
CSN: [id number]     Arrival date & time 03/30/15  1948 History  By signing my name below, I, Jarvis Morganaylor Ferguson, attest that this documentation has been prepared under the direction and in the presence of Alberteen Samhristopher P Danford, MD. Electronically Signed: Jarvis Morganaylor Ferguson, ED Scribe. 03/31/2015. 12:30 AM.    Chief Complaint  Patient presents with  . Jaw Pain   The history is provided by the patient. No language interpreter was used.    HPI Comments: Terri Rios is a 40 y.o. female who presents to the Emergency Department complaining of constant, moderate, sharp, right temporal and right parietal HA. She states the pain radiates down into the right side of her neck, right jaw and right shoulder. She reports associated photophobia and intermittent, mild numbness in her right arm. Pt was seen here yesterday for the same with no relief in pain. She was prescribed Augmentin and Valium which she has been taking with no relief. Pt has a head and neck CT done yesterday which showed right oral cavity abscess but no other acute findings. Pt endorses the pain is worse with rotating her neck. She notes she has had neck problems in the past but never this severe. She denies any h/o neck surgery or MRIs. Pt states she has f/u to multiple hospitals for this problem with no conclusive answer; she notes she has been dx with muscle spasm, TMJ and pinched nerve to neck. Pt is a non smoker. She denies any h/o IV drug use. She denies any abdominal pain, back pain, fevers, chills, cough, fatigue, blurred vision, nausea, vomiting, or weakness.   Past Medical History  Diagnosis Date  . Asthma    History reviewed. No pertinent past surgical history. History reviewed. No pertinent family history. Social History  Substance Use Topics  . Smoking status: Current Every Day Smoker  . Smokeless tobacco: None  . Alcohol Use: No   OB History    No data available     Review of Systems  Constitutional: Negative for fever,  chills and fatigue.  HENT: Positive for ear pain.   Eyes: Positive for photophobia. Negative for visual disturbance.  Gastrointestinal: Negative for nausea, vomiting and abdominal pain.  Musculoskeletal: Positive for myalgias, arthralgias and neck pain. Negative for back pain.  Neurological: Positive for numbness and headaches. Negative for weakness.      Allergies  Morphine and related and Iodine  Home Medications   Prior to Admission medications   Medication Sig Start Date End Date Taking? Authorizing Provider  albuterol (PROVENTIL HFA;VENTOLIN HFA) 108 (90 BASE) MCG/ACT inhaler Inhale into the lungs every 6 (six) hours as needed for wheezing or shortness of breath.    Historical Provider, MD  amoxicillin-clavulanate (AUGMENTIN) 875-125 MG tablet Take 1 tablet by mouth every 12 (twelve) hours. 03/29/15 04/05/15  Alvira MondayErin Schlossman, MD  diazepam (VALIUM) 5 MG tablet Take 1 tablet (5 mg total) by mouth every 12 (twelve) hours as needed for anxiety or muscle spasms. 03/29/15   Alvira MondayErin Schlossman, MD   Triage Vitals: BP 117/94 mmHg  Pulse 98  Temp(Src) 98.6 F (37 C) (Oral)  Resp 20  Ht 5\' 2"  (1.575 m)  Wt 160 lb (72.576 kg)  BMI 29.26 kg/m2  SpO2 100%  LMP 03/28/2015  Physical Exam  Constitutional: She is oriented to person, place, and time. She appears well-developed and well-nourished. No distress.  HENT:  Head: Normocephalic and atraumatic.  Tenderness to right medial face  Eyes: Conjunctivae and EOM are normal.  Neck:  Neck supple. No tracheal deviation present.  Tenderness with soasm to right trapexius Head held slightly to right side  Cardiovascular: Normal rate, regular rhythm and normal heart sounds.   Pulmonary/Chest: Effort normal and breath sounds normal. No respiratory distress.  Abdominal: Soft. There is no tenderness.  Musculoskeletal: Normal range of motion.  Decreased abduction of right shoulder Mild decreased right elbow extension Decreased sensation of right  deltoid Good radial and ulnar distrubution of right hand  Neurological: She is alert and oriented to person, place, and time.  Skin: Skin is warm and dry.  Psychiatric: She has a normal mood and affect. Her behavior is normal.  Nursing note and vitals reviewed.   ED Course  Procedures (including critical care time)  Results for orders placed or performed during the hospital encounter of 03/30/15  CBC with Differential  Result Value Ref Range   WBC 7.1 4.0 - 10.5 K/uL   RBC 4.15 3.87 - 5.11 MIL/uL   Hemoglobin 12.5 12.0 - 15.0 g/dL   HCT 16.1 09.6 - 04.5 %   MCV 90.1 78.0 - 100.0 fL   MCH 30.1 26.0 - 34.0 pg   MCHC 33.4 30.0 - 36.0 g/dL   RDW 40.9 81.1 - 91.4 %   Platelets 309 150 - 400 K/uL   Neutrophils Relative % 54 %   Neutro Abs 3.8 1.7 - 7.7 K/uL   Lymphocytes Relative 33 %   Lymphs Abs 2.3 0.7 - 4.0 K/uL   Monocytes Relative 9 %   Monocytes Absolute 0.6 0.1 - 1.0 K/uL   Eosinophils Relative 4 %   Eosinophils Absolute 0.3 0.0 - 0.7 K/uL   Basophils Relative 0 %   Basophils Absolute 0.0 0.0 - 0.1 K/uL  Basic metabolic panel  Result Value Ref Range   Sodium 136 135 - 145 mmol/L   Potassium 3.7 3.5 - 5.1 mmol/L   Chloride 106 101 - 111 mmol/L   CO2 24 22 - 32 mmol/L   Glucose, Bld 111 (H) 65 - 99 mg/dL   BUN 9 6 - 20 mg/dL   Creatinine, Ser 7.82 0.44 - 1.00 mg/dL   Calcium 9.1 8.9 - 95.6 mg/dL   GFR calc non Af Amer >60 >60 mL/min   GFR calc Af Amer >60 >60 mL/min   Anion gap 6 5 - 15   Ct Head Wo Contrast  03/29/2015  CLINICAL DATA:  Headache with tenderness over right mastoid region. EXAM: CT HEAD WITHOUT CONTRAST TECHNIQUE: Contiguous axial images were obtained from the base of the skull through the vertex without intravenous contrast. COMPARISON:  None. FINDINGS: The ventricles are normal in size and configuration. There does appear to be atrophy in the high frontal lobe regions bilaterally. There is no intracranial mass, hemorrhage, extra-axial fluid collection,  or midline shift. The gray-white compartments are normal. Bony calvarium appears intact. The mastoids on the left are clear. 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 No intraorbital lesions are identified. There is mild mucosal thickening in the anterior ethmoid air cells bilaterally. IMPRESSION: High frontal atrophy bilaterally. Ventricles are normal in size and configuration. No intracranial mass, hemorrhage, focal gray - white compartment lesion. 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 There is mucosal thickening in several anterior ethmoid air cells bilaterally. Mastoids on the left are clear. Electronically Signed   By: Bretta Bang III M.D.   On: 03/29/2015 09:55   Ct Soft Tissue Neck Wo Contrast  03/29/2015  CLINICAL DATA:  Right-sided head and neck pain.  Jaw pain with trismus. EXAM: CT NECK WITHOUT CONTRAST TECHNIQUE: Multidetector CT imaging of the neck was performed following the standard protocol without intravenous contrast. Patient has history of contrast allergy. A cauda COMPARISON:  None. FINDINGS: Evaluation of the soft tissues is suboptimal in the absence of IV contrast. There is significant periodontal disease affecting the mandibular teeth. Multiple teeth are missing, presumably extracted for periodontal disease. There is an oral cavity abscess adjacent to the medial angle of mandible on the RIGHT, 8 x 11 mm cross-section with a small bubble of air. New new see for instance image 44 series 4. No definite extension to the submandibular or sublingual space but characterization is made difficult in the absence of IV contrast. Reactive BILATERAL RIGHT greater than LEFT level 1 and level 2 lymph nodes. No mandibular osteomyelitis is detected. No acute sinus disease. Negative orbits. Airway midline. Negative visualized intracranial compartment. No lung apex lesion.  No neck masses. IMPRESSION: BILATERAL mandibular periodontal disease with a suspected RIGHT oral cavity abscess 8 x 11 mm  cross-section, adjacent to the medial angle of mandible. Reactive cervical adenopathy. Within limits for assessment on noncontrast exam, no definite extension to the submandibular or sublingual space on the RIGHT. Recommend correlation with physical exam. Oral surgical or ENT consultation may be warranted. Electronically Signed   By: Elsie Stain M.D.   On: 03/29/2015 10:08    I have personally reviewed and evaluated these images and lab results as part of my medical decision-making.   EKG Interpretation None      MDM   Final diagnoses:  Neck pain    Patient with neck pain headache and jaw pain. Has been seen in the ER for the same recently and has apparently had several visits to upper provider should the same. Does have some questionable weakness to right upper extremity. Continued pain. Will admit to internal medicine but maybe unlikely that they find the cause. I doubt that the dental abscess as the cause of the severe neck pain and muscle spasm. I personally performed the services described in this documentation, which was scribed in my presence. The recorded information has been reviewed and is accurate.       Benjiman Core, MD 03/31/15 0030

## 2015-03-31 ENCOUNTER — Encounter (HOSPITAL_COMMUNITY): Payer: Self-pay | Admitting: Internal Medicine

## 2015-03-31 ENCOUNTER — Inpatient Hospital Stay (HOSPITAL_COMMUNITY): Payer: BLUE CROSS/BLUE SHIELD

## 2015-03-31 DIAGNOSIS — R220 Localized swelling, mass and lump, head: Secondary | ICD-10-CM | POA: Diagnosis not present

## 2015-03-31 DIAGNOSIS — M542 Cervicalgia: Secondary | ICD-10-CM | POA: Diagnosis present

## 2015-03-31 DIAGNOSIS — K045 Chronic apical periodontitis: Secondary | ICD-10-CM | POA: Diagnosis present

## 2015-03-31 DIAGNOSIS — K036 Deposits [accretions] on teeth: Secondary | ICD-10-CM | POA: Diagnosis present

## 2015-03-31 DIAGNOSIS — K029 Dental caries, unspecified: Secondary | ICD-10-CM | POA: Diagnosis present

## 2015-03-31 DIAGNOSIS — Z79899 Other long term (current) drug therapy: Secondary | ICD-10-CM | POA: Diagnosis not present

## 2015-03-31 DIAGNOSIS — K0401 Reversible pulpitis: Secondary | ICD-10-CM | POA: Diagnosis present

## 2015-03-31 DIAGNOSIS — F17200 Nicotine dependence, unspecified, uncomplicated: Secondary | ICD-10-CM | POA: Diagnosis present

## 2015-03-31 DIAGNOSIS — J45909 Unspecified asthma, uncomplicated: Secondary | ICD-10-CM | POA: Diagnosis present

## 2015-03-31 DIAGNOSIS — K122 Cellulitis and abscess of mouth: Secondary | ICD-10-CM | POA: Diagnosis present

## 2015-03-31 DIAGNOSIS — K0889 Other specified disorders of teeth and supporting structures: Secondary | ICD-10-CM | POA: Diagnosis present

## 2015-03-31 DIAGNOSIS — K0530 Chronic periodontitis, unspecified: Secondary | ICD-10-CM | POA: Diagnosis present

## 2015-03-31 DIAGNOSIS — M264 Malocclusion, unspecified: Secondary | ICD-10-CM | POA: Diagnosis present

## 2015-03-31 DIAGNOSIS — K060 Gingival recession: Secondary | ICD-10-CM | POA: Diagnosis present

## 2015-03-31 LAB — CBC WITH DIFFERENTIAL/PLATELET
Basophils Absolute: 0 10*3/uL (ref 0.0–0.1)
Basophils Relative: 1 %
Eosinophils Absolute: 0.3 10*3/uL (ref 0.0–0.7)
Eosinophils Relative: 5 %
HEMATOCRIT: 36 % (ref 36.0–46.0)
HEMOGLOBIN: 12.4 g/dL (ref 12.0–15.0)
LYMPHS ABS: 2.5 10*3/uL (ref 0.7–4.0)
Lymphocytes Relative: 45 %
MCH: 31.2 pg (ref 26.0–34.0)
MCHC: 34.4 g/dL (ref 30.0–36.0)
MCV: 90.5 fL (ref 78.0–100.0)
MONOS PCT: 6 %
Monocytes Absolute: 0.3 10*3/uL (ref 0.1–1.0)
NEUTROS ABS: 2.3 10*3/uL (ref 1.7–7.7)
NEUTROS PCT: 43 %
Platelets: 269 10*3/uL (ref 150–400)
RBC: 3.98 MIL/uL (ref 3.87–5.11)
RDW: 13.2 % (ref 11.5–15.5)
WBC: 5.5 10*3/uL (ref 4.0–10.5)

## 2015-03-31 LAB — COMPREHENSIVE METABOLIC PANEL
ALBUMIN: 3.3 g/dL — AB (ref 3.5–5.0)
ALT: 14 U/L (ref 14–54)
ANION GAP: 9 (ref 5–15)
AST: 16 U/L (ref 15–41)
Alkaline Phosphatase: 61 U/L (ref 38–126)
BUN: 7 mg/dL (ref 6–20)
CHLORIDE: 106 mmol/L (ref 101–111)
CO2: 23 mmol/L (ref 22–32)
CREATININE: 0.64 mg/dL (ref 0.44–1.00)
Calcium: 8.9 mg/dL (ref 8.9–10.3)
GFR calc non Af Amer: >60 mL/min (ref >60)
Glucose, Bld: 96 mg/dL (ref 65–99)
Potassium: 4.1 mmol/L (ref 3.5–5.1)
SODIUM: 138 mmol/L (ref 135–145)
Total Bilirubin: 0.4 mg/dL (ref 0.3–1.2)
Total Protein: 6.6 g/dL (ref 6.5–8.1)

## 2015-03-31 LAB — PREGNANCY, URINE: Preg Test, Ur: NEGATIVE

## 2015-03-31 LAB — LACTIC ACID, PLASMA: LACTIC ACID, VENOUS: 0.8 mmol/L (ref 0.5–2.0)

## 2015-03-31 MED ORDER — KETOROLAC TROMETHAMINE 30 MG/ML IJ SOLN
60.0000 mg | Freq: Once | INTRAMUSCULAR | Status: AC
  Administered 2015-03-31: 60 mg via INTRAVENOUS
  Filled 2015-03-31: qty 2

## 2015-03-31 MED ORDER — HYDROMORPHONE HCL 1 MG/ML IJ SOLN
1.0000 mg | INTRAMUSCULAR | Status: DC | PRN
  Administered 2015-03-31 (×3): 1 mg via INTRAVENOUS
  Filled 2015-03-31 (×3): qty 1

## 2015-03-31 MED ORDER — ALBUTEROL SULFATE (2.5 MG/3ML) 0.083% IN NEBU: 3.0000 mL | INHALATION_SOLUTION | Freq: Four times a day (QID) | RESPIRATORY_TRACT | Status: DC | PRN

## 2015-03-31 MED ORDER — METHYLPREDNISOLONE SODIUM SUCC 125 MG IJ SOLR
60.0000 mg | Freq: Once | INTRAMUSCULAR | Status: AC
  Administered 2015-03-31: 60 mg via INTRAVENOUS
  Filled 2015-03-31: qty 2

## 2015-03-31 MED ORDER — ONDANSETRON HCL 4 MG PO TABS: 4.0000 mg | ORAL_TABLET | Freq: Four times a day (QID) | ORAL | Status: DC | PRN

## 2015-03-31 MED ORDER — NICOTINE 14 MG/24HR TD PT24
14.0000 mg | MEDICATED_PATCH | Freq: Every day | TRANSDERMAL | Status: DC
  Administered 2015-03-31 – 2015-04-01 (×2): 14 mg via TRANSDERMAL
  Filled 2015-03-31 (×2): qty 1

## 2015-03-31 MED ORDER — HYDROMORPHONE HCL 1 MG/ML IJ SOLN
2.0000 mg | INTRAMUSCULAR | Status: DC | PRN
  Administered 2015-03-31 – 2015-04-01 (×6): 2 mg via INTRAVENOUS
  Filled 2015-03-31 (×6): qty 2

## 2015-03-31 MED ORDER — ACETAMINOPHEN 650 MG RE SUPP: 650.0000 mg | Freq: Four times a day (QID) | RECTAL | Status: DC | PRN

## 2015-03-31 MED ORDER — HYDROMORPHONE HCL 1 MG/ML IJ SOLN
0.5000 mg | Freq: Once | INTRAMUSCULAR | Status: AC
  Administered 2015-03-31: 0.5 mg via INTRAVENOUS
  Filled 2015-03-31: qty 1

## 2015-03-31 MED ORDER — ACETAMINOPHEN 325 MG PO TABS
650.0000 mg | ORAL_TABLET | Freq: Four times a day (QID) | ORAL | Status: DC | PRN
  Administered 2015-03-31 – 2015-04-01 (×2): 650 mg via ORAL
  Filled 2015-03-31 (×2): qty 2

## 2015-03-31 MED ORDER — PIPERACILLIN-TAZOBACTAM 3.375 G IVPB
3.3750 g | Freq: Three times a day (TID) | INTRAVENOUS | Status: DC
Start: 2015-03-31 — End: 2015-04-01
  Administered 2015-03-31 – 2015-04-01 (×4): 3.375 g via INTRAVENOUS
  Filled 2015-03-31 (×6): qty 50

## 2015-03-31 MED ORDER — VANCOMYCIN HCL IN DEXTROSE 1-5 GM/200ML-% IV SOLN
1000.0000 mg | Freq: Three times a day (TID) | INTRAVENOUS | Status: DC
  Administered 2015-03-31 – 2015-04-01 (×5): 1000 mg via INTRAVENOUS
  Filled 2015-03-31 (×6): qty 200

## 2015-03-31 MED ORDER — SODIUM CHLORIDE 0.9 % IV SOLN
INTRAVENOUS | Status: AC
  Administered 2015-03-31: 05:00:00 via INTRAVENOUS

## 2015-03-31 MED ORDER — ONDANSETRON HCL 4 MG/2ML IJ SOLN: 4.0000 mg | Freq: Four times a day (QID) | INTRAMUSCULAR | Status: DC | PRN

## 2015-03-31 NOTE — Progress Notes (Signed)
NURSING PROGRESS NOTE  Terri Rios  MRN: [id number]  Admission Data: 03/31/2015 12:40 AM Attending Provider: Alberteen Samhristopher P Danford, MD  PCP: No PCP Per Patient  Code status: None  Allergies:  Allergies  Allergen Reactions  . Morphine And Related Shortness Of Breath  . Iodine Rash     Past Medical History:  has a past medical history of Asthma.   Past Surgical History:  has no past surgical history on file.   Terri Rios is a 40 y.o.  female patient, arrived to floor in room 5W18 via stretcher, [facility]. Patient alert and oriented X 4. No acute distress noted. Denies pain/ Complains of pain 10/10 over right neck, describing as aching, constant.   Vital signs: Oral temperature 97.5 F (36.4 C), Blood pressure 120/68, Pulse 70, RR 18, SpO2 100 % on room air. Height 5'2" (157.5 cm), weight 203 lbs (92.3 kg).   Cardiac monitoring: None  IV access: Right antecubital; condition patent and no redness.  Skin: intact, no pressure ulcer noted in sacral area.   Patient's ID armband verified with patient/ family, and in place. Information packet given to patient/ family. Fall risk assessed, SR up X2, patient/ family able to verbalize understanding of risks associated with falls and to call nurse or staff to assist before getting out of bed. Patient/ family oriented to room and equipment. Call bell within reach.

## 2015-03-31 NOTE — Progress Notes (Signed)
[facility]'s arrival to [phone].

## 2015-03-31 NOTE — Progress Notes (Signed)
ANTIBIOTIC CONSULT NOTE - INITIAL  Pharmacy Consult for Vancomycin/Zosyn  Indication: Oral abscess  Allergies  Allergen Reactions  . Morphine And Related Shortness Of Breath  . Iodine Rash    Patient Measurements: Height: 5\' 2"  (157.5 cm) Weight: 203 lb 8 oz (92.307 kg) IBW/kg (Calculated) : 50.1 Vital Signs: Temp: 97.5 F (36.4 C) (01/05 0045) Temp Source: Oral (01/05 0045) BP: 120/68 mmHg (01/05 0045) Pulse Rate: 70 (01/05 0045)  Labs:  Recent Labs  03/30/15 2135  WBC 7.1  HGB 12.5  PLT 309  CREATININE 0.70   Estimated Creatinine Clearance: 101.8 mL/min (by C-G formula based on Cr of 0.7).   Medical History: Past Medical History  Diagnosis Date  . Asthma     Assessment: Right oral cavity abscess per CT, WBC WNL, renal function good, pt here with continued neck pain, starting broad spectrum anti-biotics.   Goal of Therapy:  Vancomycin trough level 15-20 mcg/ml  Plan:  -Vancomycin 1000 mg IV q8h -Zosyn 3.375G IV q8h to be infused over 4 hours -Trend WBC, temp, renal function -Drug levels as indicated   Abran DukeLedford, Krupa Stege 03/31/2015,1:34 AM

## 2015-03-31 NOTE — Care Management Note (Signed)
Case Management Note  Patient Details  Name: Terri Rios MRN: [id number] Date of Birth: [birth year]  Subjective/Objective:                  Date- 03-31-15 Initial Assessment Spoke with patient at the bedside and spouse at the bedside.  Introduced self as Sports coachcase manager and explained role in discharge planning and how to be reached.  Verified [place] 38 year [place]..  Verified patient anticipates to go home with family at time of discharge.  Patient has no DME. Expressed potential need for no other DME.  Patient Confirmed  needing help with their medication prior to admission, however they signed up and paid for "[facility] [employer]" prior to admission and are trying to print out the cards. CM instructed patient to call the hospital with insurance information when it was received, and to print out card ASAP so they can use it at a pharmacy to get Rx filled at [place].  Patient drives to MD appointments.  Verified patient has PCP Dr Hollice EspyGibson Plan: CM will continue to follow for discharge planning and [facility] resources.   Lawerance Sabalebbie Prescilla Monger RN BSN CM [phone]   Action/Plan:  No CM needs identified at this time.  Expected Discharge Date:                  Expected Discharge Plan:  Home/Self Care  In-House Referral:     Discharge planning Services  CM Consult  Post Acute Care Choice:    Choice offered to:     DME Arranged:    DME Agency:     HH Arranged:    HH Agency:     Status of Service:  In process, will continue to follow  Medicare Important Message Given:    Date Medicare IM Given:    Medicare IM give by:    Date Additional Medicare IM Given:    Additional Medicare Important Message give by:     If discussed at Long Length of Stay Meetings, dates discussed:    Additional Comments:  Lawerance SabalDebbie Katelinn Justice, RN 03/31/2015, 1:31 PM

## 2015-03-31 NOTE — Progress Notes (Addendum)
40 y.o. female with history of asthma and tobacco abuse presents to the ER because of right-sided neck pain and mouth pain. Patient has been having this pain over the last few weeks. Patient had originally come to the ER the previous night and was given oral antibiotics for CAT scan was showing old cavity abscess  Plan  Started on zosyn and vancomycin  ,Dr. Dwain SarnaWolickki  notifed that no oral surgeon available , he will review films and get back with further recommendations  Continue Dilaudid, Toradol for pain control Also left a message for Dr Rosalee KaufmanKuzklinski, oral surgeon  Ordered steroids to help with facial swelling

## 2015-03-31 NOTE — Progress Notes (Signed)
Patient complains of pain 10/10, describing as constant aching over right neck. Pain medication not due yet. Merdis DelayK. Schorr MD paged and notified.

## 2015-03-31 NOTE — Progress Notes (Signed)
Received report on patient from Sam, Charity fundraiserN from [facility].

## 2015-03-31 NOTE — H&P (Signed)
[facility] History and Physical  Collene MaresYolanda Gieske [id number] DOB: 1975/08/03 DOA: 03/30/2015  Referring physician: Patient was transferred from [facility]. PCP: No PCP Per Patient  Specialists: None.  Chief Complaint: Right-sided neck pain and mouth pain.  HPI: Collene MaresYolanda Cathers is a 40 y.o. female with history of asthma and tobacco abuse presents to the ER because of right-sided neck pain and mouth pain. Patient has been having this pain over the last few weeks. Patient had originally come to the ER the previous night and was given oral antibiotics for CAT scan was showing old cavity abscess. Despite taking which patient was having severe pain and had come to the ER. Patient has been admitted for further management. On my exam patient has no difficulty breathing but complains of mild difficulty swallowing. CT scan shows abscesses involving the oral cavity. Patient denies any fever or chills.   Review of Systems: As presented in the history of presenting illness, rest negative.  Past Medical History  Diagnosis Date  . Asthma    Past Surgical History  Procedure Laterality Date  . Knee surgery    . Hemorrhoid surgery     Social History:  reports that she has been smoking.  She does not have any smokeless tobacco history on file. She reports that she does not drink alcohol or use illicit drugs. Where does patient live home. Can patient participate in ADLs? Yes.  Allergies  Allergen Reactions  . Morphine And Related Shortness Of Breath  . Iodine Rash    Family History:  Family History  Problem Relation Age of Onset  . Diabetes Mellitus II Mother   . Hypertension Father       Prior to Admission medications   Medication Sig Start Date End Date Taking? Authorizing Provider  albuterol (PROVENTIL HFA;VENTOLIN HFA) 108 (90 BASE) MCG/ACT inhaler Inhale into the lungs every 6 (six) hours as needed for wheezing or shortness of breath.    Historical Provider, MD   amoxicillin-clavulanate (AUGMENTIN) 875-125 MG tablet Take 1 tablet by mouth every 12 (twelve) hours. 03/29/15 04/05/15  Alvira MondayErin Schlossman, MD  diazepam (VALIUM) 5 MG tablet Take 1 tablet (5 mg total) by mouth every 12 (twelve) hours as needed for anxiety or muscle spasms. 03/29/15   Alvira MondayErin Schlossman, MD    Physical Exam: Filed Vitals:   03/30/15 2201 03/31/15 0031 03/31/15 0045 03/31/15 0048  BP: 122/99  120/68   Pulse: 78  70   Temp:   97.5 F (36.4 C)   TempSrc:   Oral   Resp: 18  18   Height:  5\' 2"  (1.575 m)    Weight:    92.307 kg (203 lb 8 oz)  SpO2: 99%  100%      General:  Moderately built and nourished.  Eyes: Anicteric no pallor.  ENT: No discharge from the ears eyes nose normal. Has mild tenderness in the right jugulodigastric area.  Neck: Tenderness in the right jugulodigastric area. No neck rigidity.  Cardiovascular: S1 and S2 heard.  Respiratory: No rhonchi or crepitations.  Abdomen: Soft nontender bowel sounds present.  Skin: No rash.  Musculoskeletal: No edema.  Psychiatric: Appears normal.  Neurologic: Alert and oriented to time place and person. Moves all extremities.  Labs on Admission:  Basic Metabolic Panel:  Recent Labs Lab 03/30/15 2135  NA 136  K 3.7  CL 106  CO2 24  GLUCOSE 111*  BUN 9  CREATININE 0.70  CALCIUM 9.1   Liver Function Tests: No  results for input(s): AST, ALT, ALKPHOS, BILITOT, PROT, ALBUMIN in the last 168 hours. No results for input(s): LIPASE, AMYLASE in the last 168 hours. No results for input(s): AMMONIA in the last 168 hours. CBC:  Recent Labs Lab 03/30/15 2135  WBC 7.1  NEUTROABS 3.8  HGB 12.5  HCT 37.4  MCV 90.1  PLT 309   Cardiac Enzymes: No results for input(s): CKTOTAL, CKMB, CKMBINDEX, TROPONINI in the last 168 hours.  BNP (last 3 results) No results for input(s): BNP in the last 8760 hours.  ProBNP (last 3 results) No results for input(s): PROBNP in the last 8760 hours.  CBG: No results for  input(s): GLUCAP in the last 168 hours.  Radiological Exams on Admission: Ct Head Wo Contrast  03/29/2015  CLINICAL DATA:  Headache with tenderness over right mastoid region. EXAM: CT HEAD WITHOUT CONTRAST TECHNIQUE: Contiguous axial images were obtained from the base of the skull through the vertex without intravenous contrast. COMPARISON:  None. FINDINGS: The ventricles are normal in size and configuration. There does appear to be atrophy in the high frontal lobe regions bilaterally. There is no intracranial mass, hemorrhage, extra-axial fluid collection, or midline shift. The gray-white compartments are normal. Bony calvarium appears intact. The mastoids on the left are clear. 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 No intraorbital lesions are identified. There is mild mucosal thickening in the anterior ethmoid air cells bilaterally. IMPRESSION: High frontal atrophy bilaterally. Ventricles are normal in size and configuration. No intracranial mass, hemorrhage, focal gray - white compartment lesion. 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 There is mucosal thickening in several anterior ethmoid air cells bilaterally. Mastoids on the left are clear. Electronically Signed   By: Bretta Bang III M.D.   On: 03/29/2015 09:55   Ct Soft Tissue Neck Wo Contrast  03/29/2015  CLINICAL DATA:  Right-sided head and neck pain. Jaw pain with trismus. EXAM: CT NECK WITHOUT CONTRAST TECHNIQUE: Multidetector CT imaging of the neck was performed following the standard protocol without intravenous contrast. Patient has history of contrast allergy. A cauda COMPARISON:  None. FINDINGS: Evaluation of the soft tissues is suboptimal in the absence of IV contrast. There is significant periodontal disease affecting the mandibular teeth. Multiple teeth are missing, presumably extracted for periodontal disease. There is an oral cavity abscess adjacent to the medial angle of mandible on the RIGHT, 8 x 11 mm cross-section with  a small bubble of air. New new see for instance image 44 series 4. No definite extension to the submandibular or sublingual space but characterization is made difficult in the absence of IV contrast. Reactive BILATERAL RIGHT greater than LEFT level 1 and level 2 lymph nodes. No mandibular osteomyelitis is detected. No acute sinus disease. Negative orbits. Airway midline. Negative visualized intracranial compartment. No lung apex lesion.  No neck masses. IMPRESSION: BILATERAL mandibular periodontal disease with a suspected RIGHT oral cavity abscess 8 x 11 mm cross-section, adjacent to the medial angle of mandible. Reactive cervical adenopathy. Within limits for assessment on noncontrast exam, no definite extension to the submandibular or sublingual space on the RIGHT. Recommend correlation with physical exam. Oral surgical or ENT consultation may be warranted. Electronically Signed   By: Elsie Stain M.D.   On: 03/29/2015 10:08     Assessment/Plan Principal Problem:   Abscess of oral space Active Problems:   Neck pain   Oral abscess   1. Abscess of the oral cavity - I have discussed along already surgeon Dr. Dwain Sarna who at  this time is advised to get oral surgeon consult and if not possible to reconsult Dr. Dwain Sarna. Patient has been placed nothing by mouth with IV antibiotics. Follow cultures. 2. Asthma presently not wheezing continue when necessary albuterol.  3. Tobacco abuse - patient advised to quit smoking.   DVT ProphylaxisSCDs.   Code Status: Full code.   Family Communication: Discussed with patient.   Disposition Plan: Admit to inpatient.   Jaselynn Tamas N. [facility] Pager [phone].  If 7PM-7AM, please contact night-coverage [email or web address] Password [facility] 03/31/2015, 4:33 AM

## 2015-04-01 ENCOUNTER — Encounter (HOSPITAL_COMMUNITY): Payer: Self-pay | Admitting: Dentistry

## 2015-04-01 ENCOUNTER — Encounter (HOSPITAL_COMMUNITY): Payer: Self-pay | Admitting: *Deleted

## 2015-04-01 DIAGNOSIS — R220 Localized swelling, mass and lump, head: Secondary | ICD-10-CM

## 2015-04-01 MED ORDER — CLINDAMYCIN HCL 300 MG PO CAPS: 300.0000 mg | ORAL_CAPSULE | Freq: Four times a day (QID) | ORAL | Status: DC

## 2015-04-01 MED ORDER — NICOTINE 14 MG/24HR TD PT24: 14.0000 mg | MEDICATED_PATCH | Freq: Every day | TRANSDERMAL | Status: DC

## 2015-04-01 MED ORDER — DIPHENHYDRAMINE HCL 50 MG/ML IJ SOLN
25.0000 mg | Freq: Four times a day (QID) | INTRAMUSCULAR | Status: DC | PRN
  Administered 2015-04-01: 25 mg via INTRAVENOUS
  Filled 2015-04-01: qty 1

## 2015-04-01 MED ORDER — AMOXICILLIN-POT CLAVULANATE 875-125 MG PO TABS: 1.0000 | ORAL_TABLET | Freq: Two times a day (BID) | ORAL | Status: AC

## 2015-04-01 MED ORDER — OXYCODONE HCL 5 MG PO CAPS: 5.0000 mg | ORAL_CAPSULE | ORAL | Status: DC | PRN

## 2015-04-01 MED FILL — AMOX-CLAV 875-125 MG TABLET: 875-125 | 15 days supply | Qty: 30 | Fill #0

## 2015-04-01 MED FILL — CLINDAMYCIN HCL 300 MG CAP: 300 | 12 days supply | Qty: 48 | Fill #0

## 2015-04-01 NOTE — Plan of Care (Signed)
Problem: Activity: Goal: Risk for activity intolerance will decrease Outcome: Progressing Patient feels better with pain management, able to ambulate with standby assist in room.

## 2015-04-01 NOTE — Discharge Summary (Signed)
Physician Discharge Summary  Terri Rios MRN: [id number] DOB/AGE: 1975-05-18 40 y.o.  PCP: No PCP Per Patient   Admit date: 03/30/2015 Discharge date: 04/01/2015  Discharge Diagnoses:   Principal Problem:   Abscess of oral space Active Problems:   Neck pain   Oral abscess    Follow-up recommendations Follow-up with PCP in 3-5 days , including all  additional recommended appointments as below Follow-up CBC, CMP in 3-5 days The operating procedure has been scheduled for Monday, 04/04/2015, at 8:45 AM at [facility].     Medication List    STOP taking these medications        diazepam 5 MG tablet  Commonly known as:  VALIUM      TAKE these medications        acetaminophen 325 MG tablet  Commonly known as:  TYLENOL  Take 650 mg by mouth every 6 (six) hours as needed for mild pain.     albuterol 108 (90 Base) MCG/ACT inhaler  Commonly known as:  PROVENTIL HFA;VENTOLIN HFA  Inhale into the lungs every 6 (six) hours as needed for wheezing or shortness of breath.     amoxicillin-clavulanate 875-125 MG tablet  Commonly known as:  AUGMENTIN  Take 1 tablet by mouth 2 (two) times daily.     clindamycin 300 MG capsule  Commonly known as:  CLEOCIN  Take 1 capsule (300 mg total) by mouth 4 (four) times daily.     nicotine 14 mg/24hr patch  Commonly known as:  NICODERM CQ - dosed in mg/24 hours  Place 1 patch (14 mg total) onto the skin daily.     oxycodone 5 MG capsule  Commonly known as:  OXY-IR  Take 1 capsule (5 mg total) by mouth every 4 (four) hours as needed.         Discharge Condition: Stable   Discharge Instructions       Discharge Instructions    Diet - low sodium heart healthy    Complete by:  As directed      Increase activity slowly    Complete by:  As directed            Allergies  Allergen Reactions  . Morphine And Related Shortness Of Breath  . Iodine Rash      Disposition: 01-Home or Self Care   Consults:  Oral  surgeon     Significant Diagnostic Studies:  Dg Orthopantogram  03/31/2015  CLINICAL DATA:  Right lower jaw pain 1 day.  No injury. EXAM: ORTHOPANTOGRAM/PANORAMIC COMPARISON:  None. FINDINGS: Examination demonstrates multiple dental caries involving the upper and lower teeth. There is subtle lucency adjacent the root of the most posterior left lower molar tooth compatible mild periodontal disease. The mandible is otherwise unremarkable. No evidence of fracture or focal lytic or sclerotic lesion. IMPRESSION: No focal bony abnormality of the right mandible. Multiple dental caries of upper and lower teeth as well as mild periodontal disease involving the most posterior left lower molar tooth. Electronically Signed   By: Marin Olp M.D.   On: 03/31/2015 14:31   Ct Head Wo Contrast  03/29/2015  CLINICAL DATA:  Headache with tenderness over right mastoid region. EXAM: CT HEAD WITHOUT CONTRAST TECHNIQUE: Contiguous axial images were obtained from the base of the skull through the vertex without intravenous contrast. COMPARISON:  None. FINDINGS: The ventricles are normal in size and configuration. There does appear to be atrophy in the high frontal lobe regions bilaterally. There is no intracranial mass, hemorrhage,  extra-axial fluid collection, or midline shift. The gray-white compartments are normal. Bony calvarium appears intact. The mastoids on the left are clear. 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 No intraorbital lesions are identified. There is mild mucosal thickening in the anterior ethmoid air cells bilaterally. IMPRESSION: High frontal atrophy bilaterally. Ventricles are normal in size and configuration. No intracranial mass, hemorrhage, focal gray - white compartment lesion. 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 There is mucosal thickening in several anterior ethmoid air cells bilaterally. Mastoids on the left are clear. Electronically Signed   By: Lowella Grip III M.D.   On:  03/29/2015 09:55   Ct Soft Tissue Neck Wo Contrast  03/29/2015  CLINICAL DATA:  Right-sided head and neck pain. Jaw pain with trismus. EXAM: CT NECK WITHOUT CONTRAST TECHNIQUE: Multidetector CT imaging of the neck was performed following the standard protocol without intravenous contrast. Patient has history of contrast allergy. A cauda COMPARISON:  None. FINDINGS: Evaluation of the soft tissues is suboptimal in the absence of IV contrast. There is significant periodontal disease affecting the mandibular teeth. Multiple teeth are missing, presumably extracted for periodontal disease. There is an oral cavity abscess adjacent to the medial angle of mandible on the RIGHT, 8 x 11 mm cross-section with a small bubble of air. New new see for instance image 44 series 4. No definite extension to the submandibular or sublingual space but characterization is made difficult in the absence of IV contrast. Reactive BILATERAL RIGHT greater than LEFT level 1 and level 2 lymph nodes. No mandibular osteomyelitis is detected. No acute sinus disease. Negative orbits. Airway midline. Negative visualized intracranial compartment. No lung apex lesion.  No neck masses. IMPRESSION: BILATERAL mandibular periodontal disease with a suspected RIGHT oral cavity abscess 8 x 11 mm cross-section, adjacent to the medial angle of mandible. Reactive cervical adenopathy. Within limits for assessment on noncontrast exam, no definite extension to the submandibular or sublingual space on the RIGHT. Recommend correlation with physical exam. Oral surgical or ENT consultation may be warranted. Electronically Signed   By: Staci Righter M.D.   On: 03/29/2015 10:08         Filed Weights   03/30/15 1954 03/31/15 0048  Weight: 72.576 kg (160 lb) 92.307 kg (203 lb 8 oz)     Microbiology: No results found for this or any previous visit (from the past 240 hour(s)).     Blood Culture    Component Value Date/Time   SDES URINE, CLEAN CATCH  03/12/2015 1753   SPECREQUEST NONE 03/12/2015 1753   CULT  03/12/2015 1753    >=100,000 COLONIES/mL KLEBSIELLA PNEUMONIAE Performed at [facility] 03/14/2015 FINAL 03/12/2015 1753      Labs: Results for orders placed or performed during the hospital encounter of 03/30/15 (from the past 48 hour(s))  CBC with Differential     Status: None   Collection Time: 03/30/15  9:35 PM  Result Value Ref Range   WBC 7.1 4.0 - 10.5 K/uL   RBC 4.15 3.87 - 5.11 MIL/uL   Hemoglobin 12.5 12.0 - 15.0 g/dL   HCT 37.4 36.0 - 46.0 %   MCV 90.1 78.0 - 100.0 fL   MCH 30.1 26.0 - 34.0 pg   MCHC 33.4 30.0 - 36.0 g/dL   RDW 12.7 11.5 - 15.5 %   Platelets 309 150 - 400 K/uL   Neutrophils Relative % 54 %   Neutro Abs 3.8 1.7 - 7.7 K/uL   Lymphocytes Relative  33 %   Lymphs Abs 2.3 0.7 - 4.0 K/uL   Monocytes Relative 9 %   Monocytes Absolute 0.6 0.1 - 1.0 K/uL   Eosinophils Relative 4 %   Eosinophils Absolute 0.3 0.0 - 0.7 K/uL   Basophils Relative 0 %   Basophils Absolute 0.0 0.0 - 0.1 K/uL  Basic metabolic panel     Status: Abnormal   Collection Time: 03/30/15  9:35 PM  Result Value Ref Range   Sodium 136 135 - 145 mmol/L   Potassium 3.7 3.5 - 5.1 mmol/L   Chloride 106 101 - 111 mmol/L   CO2 24 22 - 32 mmol/L   Glucose, Bld 111 (H) 65 - 99 mg/dL   BUN 9 6 - 20 mg/dL   Creatinine, Ser 0.70 0.44 - 1.00 mg/dL   Calcium 9.1 8.9 - 10.3 mg/dL   GFR calc non Af Amer >60 >60 mL/min   GFR calc Af Amer >60 >60 mL/min    Comment: (NOTE) The eGFR has been calculated using the CKD EPI equation. This calculation has not been validated in all clinical situations. eGFR's persistently <60 mL/min signify possible Chronic Kidney Disease.    Anion gap 6 5 - 15  Lactic acid, plasma     Status: None   Collection Time: 03/31/15  2:35 AM  Result Value Ref Range   Lactic Acid, Venous 0.8 0.5 - 2.0 mmol/L  Comprehensive metabolic panel     Status: Abnormal   Collection Time: 03/31/15  6:00  AM  Result Value Ref Range   Sodium 138 135 - 145 mmol/L   Potassium 4.1 3.5 - 5.1 mmol/L   Chloride 106 101 - 111 mmol/L   CO2 23 22 - 32 mmol/L   Glucose, Bld 96 65 - 99 mg/dL   BUN 7 6 - 20 mg/dL   Creatinine, Ser 0.64 0.44 - 1.00 mg/dL   Calcium 8.9 8.9 - 10.3 mg/dL   Total Protein 6.6 6.5 - 8.1 g/dL   Albumin 3.3 (L) 3.5 - 5.0 g/dL   AST 16 15 - 41 U/L   ALT 14 14 - 54 U/L   Alkaline Phosphatase 61 38 - 126 U/L   Total Bilirubin 0.4 0.3 - 1.2 mg/dL   GFR calc non Af Amer >60 >60 mL/min   GFR calc Af Amer >60 >60 mL/min    Comment: (NOTE) The eGFR has been calculated using the CKD EPI equation. This calculation has not been validated in all clinical situations. eGFR's persistently <60 mL/min signify possible Chronic Kidney Disease.    Anion gap 9 5 - 15  CBC WITH DIFFERENTIAL     Status: None   Collection Time: 03/31/15  6:00 AM  Result Value Ref Range   WBC 5.5 4.0 - 10.5 K/uL   RBC 3.98 3.87 - 5.11 MIL/uL   Hemoglobin 12.4 12.0 - 15.0 g/dL   HCT 36.0 36.0 - 46.0 %   MCV 90.5 78.0 - 100.0 fL   MCH 31.2 26.0 - 34.0 pg   MCHC 34.4 30.0 - 36.0 g/dL   RDW 13.2 11.5 - 15.5 %   Platelets 269 150 - 400 K/uL   Neutrophils Relative % 43 %   Neutro Abs 2.3 1.7 - 7.7 K/uL   Lymphocytes Relative 45 %   Lymphs Abs 2.5 0.7 - 4.0 K/uL   Monocytes Relative 6 %   Monocytes Absolute 0.3 0.1 - 1.0 K/uL   Eosinophils Relative 5 %   Eosinophils Absolute 0.3 0.0 - 0.7  K/uL   Basophils Relative 1 %   Basophils Absolute 0.0 0.0 - 0.1 K/uL  Pregnancy, urine     Status: None   Collection Time: 03/31/15  6:45 AM  Result Value Ref Range   Preg Test, Ur NEGATIVE NEGATIVE    Comment:        THE SENSITIVITY OF THIS METHODOLOGY IS >20 mIU/mL.      Lipid Panel  No results found for: CHOL, TRIG, HDL, CHOLHDL, VLDL, LDLCALC, LDLDIRECT   No results found for: HGBA1C   Lab Results  Component Value Date   CREATININE 0.64 03/31/2015     HPI :40 year old female with history of  right facial swelling and lower right tooth pain. Patient has been experiencing toothaches over the past several months. The pain worsened and patient subsequently developed facial swelling. Patient presented to ED on Tuesday, 03/29/2015. Patient was prescribed Augmentin antibiotic therapy at that time. Patient represented to ED on Wednesday with increased facial swelling and was admitted at that time and placed on IV antibiotic therapy. Patient is currently on IV Zosyn and Vancomycin therapy. Patient also is on appropriate narcotic pain medication. CT scan was obtained with possible dental abscess noted. Orthopantogram was then ordered for evaluation of dentition and possible dental etiology. Patient has been having symptoms of sharp, acute, spontaneous dental pain that reached an intensity of 10 out of 10. Pain is currently 6 out of 10 in intensity with antibiotic therapy and pain medication. Patient indicates that she had significant facial swelling that has resolved with the current IV antibiotic therapy. The patient last saw a dentist approximately one to one and a half years ago in [place], [place]. Patient had a tooth pulled at that time with no complications. Patient does not seek regular dental care due to economic concerns  HOSPITAL COURSE:   1. History of right facial swelling 2. Acute pulpitis 3. Chronic apical periodontitis  4. 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 Accretions 8. Tooth mobility 9. Multiple missing teeth 10. Poor occlusal scheme and malocclusion 12. History of oral neglect 1  Plan Oral surgery consulted, patient was started on vancomycin and Zosyn 2 days Patient was evaluated by Lenn Cal, DDS The patient currently wishes to proceed with extraction of tooth numbers 19, 29, and 30 in the operating room general anesthesia. Patient refuses extraction of tooth numbers 7, 8, 9 at this time and will follow-up with a dentist of  her choice for exam, radiographs, and discussion of overall dental treatment needs in the future. The operating procedure has been scheduled for Monday, 04/04/2015, at 8:45 AM at [facility]  Patient discharged home with written instructions about presurgical area and timing of surgery    Discharge Exam:    Blood pressure 109/62, pulse 73, temperature 98.3 F (36.8 C), temperature source Oral, resp. rate 20, height '5\' 2"'$  (1.575 m), weight 92.307 kg (203 lb 8 oz), last menstrual period 03/28/2015, SpO2 98 %.  General: Moderately built and nourished.  Eyes: Anicteric no pallor.  ENT: No discharge from the ears eyes nose normal. Has mild tenderness in the right jugulodigastric area.  Neck: Tenderness in the right jugulodigastric area. No neck rigidity.  Cardiovascular: S1 and S2 heard.  Respiratory: No rhonchi or crepitations.  Abdomen: Soft nontender bowel sounds present.  Skin: No rash.  Musculoskeletal: No edema.  Psychiatric: Appears normal.  Neurologic: Alert and oriented to time place and person. Moves all extremities.     Follow-up  Information    Follow up with Teena Dunk, DDS.   Specialty:  Dentistry   Contact information:   [place] [address] [phone]       Signed: Reyne Dumas 04/01/2015, 12:33 PM        Time spent >45 mins

## 2015-04-01 NOTE — Progress Notes (Signed)
Nsg Discharge Note  Admit Date:  03/30/2015 Discharge date: 04/01/2015   Terri MaresYolanda Rios to be D/C'd Home per MD order.  AVS completed.  Copy for chart, and copy for patient signed, and dated. Patient/caregiver able to verbalize understanding.  Discharge Medication:   Medication List    STOP taking these medications        diazepam 5 MG tablet  Commonly known as:  VALIUM      TAKE these medications        acetaminophen 325 MG tablet  Commonly known as:  TYLENOL  Take 650 mg by mouth every 6 (six) hours as needed for mild pain.     albuterol 108 (90 Base) MCG/ACT inhaler  Commonly known as:  PROVENTIL HFA;VENTOLIN HFA  Inhale into the lungs every 6 (six) hours as needed for wheezing or shortness of breath.     amoxicillin-clavulanate 875-125 MG tablet  Commonly known as:  AUGMENTIN  Take 1 tablet by mouth 2 (two) times daily.     clindamycin 300 MG capsule  Commonly known as:  CLEOCIN  Take 1 capsule (300 mg total) by mouth 4 (four) times daily.     nicotine 14 mg/24hr patch  Commonly known as:  NICODERM CQ - dosed in mg/24 hours  Place 1 patch (14 mg total) onto the skin daily.     oxycodone 5 MG capsule  Commonly known as:  OXY-IR  Take 1 capsule (5 mg total) by mouth every 4 (four) hours as needed.        Discharge Assessment: Filed Vitals:   03/31/15 2136 04/01/15 0519  BP: 118/67 109/62  Pulse: 98 73  Temp: 98.6 F (37 C) 98.3 F (36.8 C)  Resp: 20 20   Skin clean, dry and intact without evidence of skin break down, no evidence of skin tears noted. IV catheter discontinued intact. Site without signs and symptoms of complications - no redness or edema noted at insertion site, patient denies c/o pain - only slight tenderness at site.  Dressing with slight pressure applied.  D/c Instructions-Education: Discharge instructions given to patient/family with verbalized understanding. D/c education completed with patient/family including follow up instructions,  medication list, d/c activities limitations if indicated, with other d/c instructions as indicated by MD - patient able to verbalize understanding, all questions fully answered. Patient instructed to return to ED, call 911, or call MD for any changes in condition.  Patient escorted via WC, and D/C home via private auto.  Terri ReapBrumagin, [name], RN 04/01/2015 2:22 PM

## 2015-04-01 NOTE — Discharge Instructions (Signed)
The operating procedure has been scheduled for Monday, 04/04/2015, at [facility] Please arrives by 6:30 in the morning, Call presurgical [phone]

## 2015-04-01 NOTE — Consult Note (Signed)
DENTAL CONSULTATION  Date of Consultation:  04/01/2015 Patient Name:   Terri Rios Date of Birth:   September 03, 1975 Medical Record Number: [id number]  VITALS: BP 109/62 mmHg  Pulse 73  Temp(Src) 98.3 F (36.8 C) (Oral)  Resp 20  Ht 5\' 2"  (1.575 m)  Wt 203 lb 8 oz (92.307 kg)  BMI 37.21 kg/m2  SpO2 98%  LMP 03/28/2015  CHIEF COMPLAINT: Patient referred by Dr. Susie CassetteAbrol for evaluation of right facial swelling.  HPI: Terri Rios is a 40 year old female with history of right facial swelling and lower right tooth pain. Patient has been experiencing toothaches over the past several months. The pain worsened and patient subsequently developed facial swelling. Patient presented to ED on Tuesday, 03/29/2015. Patient was prescribed Augmentin antibiotic therapy at that time. Patient represented to ED on Wednesday with increased facial swelling and was admitted at that time and placed on IV antibiotic therapy. Patient is currently on IV Zosyn and Vancomycin therapy. Patient also is on appropriate narcotic pain medication. CT scan was obtained with possible dental abscess noted. Orthopantogram was then ordered for evaluation of dentition and possible dental etiology.  Patient has been having symptoms of sharp, acute, spontaneous dental pain that reached an intensity of 10 out of 10. Pain is currently 6 out of 10 in intensity with antibiotic therapy and pain medication. Patient indicates that she had significant facial swelling that has resolved with the current IV antibiotic therapy. The patient last saw a dentist approximately one to one and a [place] years ago [place]  Patient had a tooth pulled at that time with no complications. Patient does not seek regular dental care due to economic concerns.  PROBLEM LIST: Patient Active Problem List   Diagnosis Date Noted  . Abscess of oral space 03/31/2015  . Oral abscess 03/31/2015  . Neck pain 03/30/2015    PMH: Past Medical History  Diagnosis Date  .  Asthma     PSH: Past Surgical History  Procedure Laterality Date  . Knee surgery    . Hemorrhoid surgery      ALLERGIES: Allergies  Allergen Reactions  . Morphine And Related Shortness Of Breath  . Iodine Rash    MEDICATIONS: Current Facility-Administered Medications  Medication Dose Route Frequency Provider Last Rate Last Dose  . acetaminophen (TYLENOL) tablet 650 mg  650 mg Oral Q6H PRN Eduard ClosArshad N Kakrakandy, MD   650 mg at 04/01/15 0540   Or  . acetaminophen (TYLENOL) suppository 650 mg  650 mg Rectal Q6H PRN Eduard ClosArshad N Kakrakandy, MD      . albuterol (PROVENTIL) (2.5 MG/3ML) 0.083% nebulizer solution 3 mL  3 mL Inhalation Q6H PRN Eduard ClosArshad N Kakrakandy, MD      . HYDROmorphone (DILAUDID) injection 2 mg  2 mg Intravenous Q4H PRN Richarda OverlieNayana Abrol, MD   2 mg at 04/01/15 0730  . nicotine (NICODERM CQ - dosed in mg/24 hours) patch 14 mg  14 mg Transdermal Daily Richarda OverlieNayana Abrol, MD   14 mg at 03/31/15 1837  . ondansetron (ZOFRAN) tablet 4 mg  4 mg Oral Q6H PRN Eduard ClosArshad N Kakrakandy, MD       Or  . ondansetron [facility](ZOFRAN) injection 4 mg  4 mg Intravenous Q6H PRN Eduard ClosArshad N Kakrakandy, MD      . piperacillin-tazobactam (ZOSYN) IVPB 3.375 g  3.375 g Intravenous 3 times per day Stevphen RochesterJames L Ledford, RPH   3.375 g at 04/01/15 0534  . vancomycin (VANCOCIN) IVPB 1000 mg/200 mL premix  1,000 mg Intravenous  Q8H Stevphen Rochester, RPH   1,000 mg at 04/01/15 0241    LABS: Lab Results  Component Value Date   WBC 5.5 03/31/2015   HGB 12.4 03/31/2015   HCT 36.0 03/31/2015   MCV 90.5 03/31/2015   PLT 269 03/31/2015      Component Value Date/Time   NA 138 03/31/2015 0600   K 4.1 03/31/2015 0600   CL 106 03/31/2015 0600   CO2 23 03/31/2015 0600   GLUCOSE 96 03/31/2015 0600   BUN 7 03/31/2015 0600   CREATININE 0.64 03/31/2015 0600   CALCIUM 8.9 03/31/2015 0600   GFRNONAA >60 03/31/2015 0600   GFRAA >60 03/31/2015 0600   No results found for: INR, PROTIME No results found for: PTT  SOCIAL HISTORY: Social  History   Social History  . Marital Status: Married    Spouse Name: N/A  . Number of Children: N/A  . Years of Education: N/A   Occupational History  . Not on file.   Social History Main Topics  . Smoking status: Current Every Day Smoker  . Smokeless tobacco: Not on file  . Alcohol Use: No  . Drug Use: No  . Sexual Activity: Not on file   Other Topics Concern  . Not on file   Social History Narrative    FAMILY HISTORY: Family History  Problem Relation Age of Onset  . Diabetes Mellitus II Mother   . Hypertension Father     REVIEW OF SYSTEMS: Constitutional: Negative for fever, chills and fatigue.  HENT: Positive for tooth pain.+ Right facial and neck swelling  Eyes: Positive for photophobia. Negative for visual disturbance.  Gastrointestinal: Negative for nausea, vomiting and abdominal pain.  Musculoskeletal: Positive for myalgias, arthraalgias, and neck pain. Negative for back pain.  Neurological: Positive for headaches. Negative for weakness.  Endocrine: Negative for diabetes and thyroid problems. Skin: No rashes or skin problems. Cardiovascular: Denies chest pain, palpitations, SOB Heme: No history of bleeding complications, anemia  DENTAL HISTORY: CHIEF COMPLAINT: Patient referred by Dr. Susie Cassette for evaluation of right facial swelling.  HPI: Terri Rios is a 40 year old female with history of right facial swelling and lower right tooth pain. Patient has been experiencing toothaches over the past several months. The pain worsened and patient subsequently developed facial swelling. Patient presented to ED on Tuesday, 03/29/2015. Patient was prescribed Augmentin antibiotic therapy at that time. Patient represented to ED on Wednesday with increased facial swelling and was admitted at that time and placed on IV antibiotic therapy. Patient is currently on IV Zosyn and Vancomycin therapy. Patient also is on appropriate narcotic pain medication. CT scan was obtained with  possible dental abscess noted. Orthopantogram was then ordered for evaluation of dentition and possible dental etiology.  Patient has been having symptoms of sharp, acute, spontaneous dental pain that reached an intensity of 10 out of 10. Pain is currently 6 out of 10 in intensity with antibiotic therapy and pain medication. Patient indicates that she had significant facial swelling that has resolved with the current IV antibiotic therapy. The patient last saw a dentist approximately one to one and a half years ago in [place], [place].  Patient had a tooth pulled at that time with no complications. Patient does not seek regular dental care due to economic concerns.  DENTAL EXAMINATION: GENERAL: Patient is a well-developed, well-nourished female in no acute distress. HEAD AND NECK: Patient has right neck lymphadenopathy that is tender to palpation. No left neck lymphadenopathy noted. Patient denies acute TMJ symptoms.  Maximum interincisal opening is 30 mm. Some right facial swelling noted.  INTRAORAL : Normal saliva. There is no evidence of oral abscess formation.  DENTITION: Patient is missing tooth numbers 1, 2, 4, 12, 13, 15, 16, 17, 18, 31, and 32.  PERIODONTAL:  The patient has chronic periodontitis with plaque and calculus accumulations, gingival recession, and incipient tooth mobility. There is incipient to moderate bone loss. DENTAL CARIES/SUBOPTIMAL RESTORATIONS: Patient has multiple dental carious lesions with extensive dental caries  involving tooth numbers 19, 29, and 30. I would need a full series of dental radiographs to evaluate for further dental caries. ENDODONTIC:  Patient has history of acute pulpitis symptoms. There is periapical pathology and radiolucency associated with the apices of tooth numbers 19, and 30  CROWN AND BRIDGE: No crown or bridge restorations.  PROSTHODONTIC: No history of partial dentures. OCCLUSION: Patient has a poor occlusal scheme secondary to multiple missing  teeth, supra-eruption and drifting of the unopposed teeth into the edentulous areas and lack of replacement of all missing teeth with dental prostheses   RADIOGRAPHIC INTERPRETATION: AN ORTHOPANTOGRAM WAS TAKEN ON 03/31/2015.  This is suboptimal. There are multiple missing teeth. There are multiple dental caries noted. There is incipient to moderate bone loss noted. There is periapical radiolucency associated with tooth numbers 19 and 30. There is supra-eruption and drifting of the unopposed teeth into the edentulous areas.  ASSESSMENTS: 1. History of right facial swelling 2. Acute pulpitis 3. Chronic apical periodontitis   4.  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 Accretions 8. Tooth mobility 9. Multiple missing teeth 10. Poor occlusal scheme and malocclusion 12. History of oral neglect   PLAN/RECOMMENDATIONS: 1. I discussed the risks, benefits, and complications of various treatment options with the patient in relationship to her medical and dental conditions. We discussed various treatment options to include no treatment, multiple extractions with alveoloplasty, pre-prosthetic surgery as indicated, periodontal therapy, dental restorations, root canal therapy, crown and bridge therapy, implant therapy, and replacement of missing teeth as indicated. The patient currently wishes to proceed with extraction of tooth numbers 19, 29, and 30 in the operating room general anesthesia. Patient refuses extraction of tooth numbers 7, 8, 9 at this time and will follow-up with a dentist of her choice for exam, radiographs, and discussion of overall dental treatment needs in the future. The operating procedure has been scheduled for Monday, 04/04/2015, at 8:45 AM at [facility].  She is currently scheduled for the operating procedure as an inpatient, however, this can be changed to an outpatient procedure if patient is discharged. Alternatively, the patient may follow-up  with an oral surgeon or general dentist of her choice of these extractions if she is discharged. In the meantime the patient will be maintained on IV or oral antibiotic therapy and pain medications per the medical team.   2. Discussion of findings with medical team and coordination of future medical and dental care as needed.   Charlynne Pander, DDS

## 2015-04-01 NOTE — Care Management Note (Signed)
Case Management Note  Patient Details  Name: Terri Rios MRN: [id number] Date of Birth: [birth year]  Subjective/Objective:  Patient lives with spouse, she is for dc today, needs med ast. NCM made apt for hospital follow up at [facility] on 1/23 ato 10:30, [facility], NCM gave patient brochures for follow up information.                    Action/Plan:   Expected Discharge Date:                  Expected Discharge Plan:  Home/Self Care  In-House Referral:     Discharge planning Services  CM Consult, Indigent Health Clinic, Medication Assistance  Post Acute Care Choice:    Choice offered to:     DME Arranged:    DME Agency:     HH Arranged:    HH Agency:     Status of Service:  Completed, signed off  Medicare Important Message Given:    Date Medicare IM Given:    Medicare IM give by:    Date Additional Medicare IM Given:    Additional Medicare Important Message give by:     If discussed at Long Length of Stay Meetings, dates discussed:    Additional Comments:  Terri Rios, Terri Mcmasters [place], RN 04/01/2015, 1:57 PM

## 2015-04-03 ENCOUNTER — Encounter (HOSPITAL_COMMUNITY): Payer: Self-pay | Admitting: Anesthesiology

## 2015-04-03 NOTE — Anesthesia Preprocedure Evaluation (Addendum)
Anesthesia Evaluation  Patient identified by MRN, date of birth, ID band Patient awake    Reviewed: Allergy & Precautions, NPO status , Patient's Chart, lab work & pertinent test results  Airway Mallampati: IV  TM Distance: >3 FB Neck ROM: Full  Mouth opening: Limited Mouth Opening  Dental  (+) Poor Dentition   Pulmonary asthma , pneumonia, resolved, Current Smoker,    Pulmonary exam normal breath sounds clear to auscultation       Cardiovascular negative cardio ROS Normal cardiovascular exam Rhythm:Regular Rate:Normal     Neuro/Psych  Headaches, Anxiety Right CTS  Neuromuscular disease    GI/Hepatic negative GI ROS, Neg liver ROS,   Endo/Other    Renal/GU Renal diseaseHx/o Renal Calculi  negative genitourinary   Musculoskeletal Oral Abscess   Abdominal (+) + obese,   Peds  Hematology   Anesthesia Other Findings   Reproductive/Obstetrics                            Anesthesia Physical Anesthesia Plan  ASA: II  Anesthesia Plan: General   Post-op Pain Management:    Induction: Intravenous  Airway Management Planned: Nasal ETT and Oral ETT  Additional Equipment:   Intra-op Plan:   Post-operative Plan: Extubation in OR  Informed Consent: I have reviewed the patients History and Physical, chart, labs and discussed the procedure including the risks, benefits and alternatives for the proposed anesthesia with the patient or authorized representative who has indicated his/her understanding and acceptance.   Dental advisory given  Plan Discussed with: CRNA, Anesthesiologist and Surgeon  Anesthesia Plan Comments:        Anesthesia Quick Evaluation

## 2015-04-04 ENCOUNTER — Ambulatory Visit (HOSPITAL_COMMUNITY): Payer: BLUE CROSS/BLUE SHIELD | Admitting: Anesthesiology

## 2015-04-04 ENCOUNTER — Encounter (HOSPITAL_COMMUNITY)
Admission: RE | Disposition: A | Discharge status: Home / Self Care | Payer: Self-pay | Source: Ambulatory Visit | Attending: Dentistry

## 2015-04-04 ENCOUNTER — Ambulatory Visit (HOSPITAL_COMMUNITY)
Admission: RE | Admit: 2015-04-04 | Discharge: 2015-04-04 | Disposition: A | Discharge status: Home / Self Care | Payer: BLUE CROSS/BLUE SHIELD | Source: Ambulatory Visit | Attending: Dentistry | Admitting: Dentistry

## 2015-04-04 ENCOUNTER — Encounter (HOSPITAL_COMMUNITY): Payer: Self-pay | Admitting: *Deleted

## 2015-04-04 DIAGNOSIS — K029 Dental caries, unspecified: Secondary | ICD-10-CM

## 2015-04-04 DIAGNOSIS — E669 Obesity, unspecified: Secondary | ICD-10-CM | POA: Insufficient documentation

## 2015-04-04 DIAGNOSIS — Z79899 Other long term (current) drug therapy: Secondary | ICD-10-CM | POA: Diagnosis not present

## 2015-04-04 DIAGNOSIS — J45909 Unspecified asthma, uncomplicated: Secondary | ICD-10-CM | POA: Diagnosis not present

## 2015-04-04 DIAGNOSIS — F17200 Nicotine dependence, unspecified, uncomplicated: Secondary | ICD-10-CM | POA: Insufficient documentation

## 2015-04-04 DIAGNOSIS — K0401 Reversible pulpitis: Secondary | ICD-10-CM

## 2015-04-04 DIAGNOSIS — K122 Cellulitis and abscess of mouth: Secondary | ICD-10-CM | POA: Insufficient documentation

## 2015-04-04 DIAGNOSIS — K045 Chronic apical periodontitis: Secondary | ICD-10-CM

## 2015-04-04 DIAGNOSIS — R220 Localized swelling, mass and lump, head: Secondary | ICD-10-CM

## 2015-04-04 HISTORY — DX: Carpal tunnel syndrome, right upper limb: G56.01

## 2015-04-04 HISTORY — DX: Headache, unspecified: R51.9

## 2015-04-04 HISTORY — PX: MULTIPLE EXTRACTIONS WITH ALVEOLOPLASTY: SHX5342

## 2015-04-04 HISTORY — DX: Other muscle spasm: M62.838

## 2015-04-04 HISTORY — DX: Pneumonia, unspecified organism: J18.9

## 2015-04-04 HISTORY — DX: Calculus of kidney: N20.0

## 2015-04-04 HISTORY — DX: Anxiety disorder, unspecified: F41.9

## 2015-04-04 HISTORY — DX: Headache: R51

## 2015-04-04 SURGERY — MULTIPLE EXTRACTION WITH ALVEOLOPLASTY
Anesthesia: General | Site: Mouth | Laterality: Bilateral

## 2015-04-04 MED ORDER — FENTANYL CITRATE (PF) 100 MCG/2ML IJ SOLN
INTRAMUSCULAR | Status: AC
  Administered 2015-04-04: 50 ug via INTRAVENOUS
  Filled 2015-04-04: qty 2

## 2015-04-04 MED ORDER — LIDOCAINE-EPINEPHRINE 2 %-1:100000 IJ SOLN
INTRAMUSCULAR | Status: AC
  Filled 2015-04-04: qty 5.1

## 2015-04-04 MED ORDER — ONDANSETRON HCL 4 MG/2ML IJ SOLN
INTRAMUSCULAR | Status: DC | PRN
  Administered 2015-04-04: 4 mg via INTRAVENOUS

## 2015-04-04 MED ORDER — ROCURONIUM BROMIDE 100 MG/10ML IV SOLN
INTRAVENOUS | Status: DC | PRN
  Administered 2015-04-04: 30 mg via INTRAVENOUS

## 2015-04-04 MED ORDER — ISOPROPYL ALCOHOL 70 % SOLN
Status: DC | PRN
  Administered 2015-04-04: 1 via TOPICAL

## 2015-04-04 MED ORDER — 0.9 % SODIUM CHLORIDE (POUR BTL) OPTIME
TOPICAL | Status: DC | PRN
  Administered 2015-04-04: 1000 mL

## 2015-04-04 MED ORDER — SUCCINYLCHOLINE CHLORIDE 20 MG/ML IJ SOLN
INTRAMUSCULAR | Status: AC
  Filled 2015-04-04: qty 1

## 2015-04-04 MED ORDER — OXYCODONE-ACETAMINOPHEN 5-325 MG PO TABS
ORAL_TABLET | ORAL | Status: AC
  Administered 2015-04-04: 2 via ORAL
  Filled 2015-04-04: qty 2

## 2015-04-04 MED ORDER — DIPHENHYDRAMINE HCL 50 MG/ML IJ SOLN
INTRAMUSCULAR | Status: DC | PRN
  Administered 2015-04-04: 12.5 mg via INTRAVENOUS

## 2015-04-04 MED ORDER — OXYCODONE-ACETAMINOPHEN 5-325 MG PO TABS: ORAL_TABLET | ORAL | Status: DC

## 2015-04-04 MED ORDER — FENTANYL CITRATE (PF) 100 MCG/2ML IJ SOLN
25.0000 ug | INTRAMUSCULAR | Status: DC | PRN
Start: 2015-04-04 — End: 2015-04-04
  Administered 2015-04-04 (×2): 50 ug via INTRAVENOUS

## 2015-04-04 MED ORDER — BUPIVACAINE-EPINEPHRINE 0.5% -1:200000 IJ SOLN
INTRAMUSCULAR | Status: DC | PRN
  Administered 2015-04-04 (×2): .5 mL

## 2015-04-04 MED ORDER — MIDAZOLAM HCL 5 MG/5ML IJ SOLN
INTRAMUSCULAR | Status: DC | PRN
  Administered 2015-04-04: 2 mg via INTRAVENOUS

## 2015-04-04 MED ORDER — ARTIFICIAL TEARS OP OINT
TOPICAL_OINTMENT | OPHTHALMIC | Status: AC
  Filled 2015-04-04: qty 3.5

## 2015-04-04 MED ORDER — METOCLOPRAMIDE HCL 5 MG/ML IJ SOLN: 10.0000 mg | Freq: Once | INTRAMUSCULAR | Status: DC | PRN

## 2015-04-04 MED ORDER — DIPHENHYDRAMINE HCL 50 MG/ML IJ SOLN
INTRAMUSCULAR | Status: AC
  Filled 2015-04-04: qty 1

## 2015-04-04 MED ORDER — OXYCODONE-ACETAMINOPHEN 5-325 MG PO TABS
1.0000 | ORAL_TABLET | ORAL | Status: DC | PRN
  Administered 2015-04-04: 2 via ORAL

## 2015-04-04 MED ORDER — LIDOCAINE-EPINEPHRINE 2 %-1:100000 IJ SOLN
INTRAMUSCULAR | Status: DC | PRN
  Administered 2015-04-04 (×3): 1.7 mL via INTRADERMAL

## 2015-04-04 MED ORDER — DEXAMETHASONE SODIUM PHOSPHATE 4 MG/ML IJ SOLN
INTRAMUSCULAR | Status: DC | PRN
  Administered 2015-04-04: 4 mg via INTRAVENOUS

## 2015-04-04 MED ORDER — DEXAMETHASONE SODIUM PHOSPHATE 4 MG/ML IJ SOLN
INTRAMUSCULAR | Status: AC
  Filled 2015-04-04: qty 1

## 2015-04-04 MED ORDER — NEOSTIGMINE METHYLSULFATE 10 MG/10ML IV SOLN
INTRAVENOUS | Status: DC | PRN
  Administered 2015-04-04: 4 mg via INTRAVENOUS

## 2015-04-04 MED ORDER — PROPOFOL 10 MG/ML IV BOLUS
INTRAVENOUS | Status: AC
  Filled 2015-04-04: qty 20

## 2015-04-04 MED ORDER — ROCURONIUM BROMIDE 50 MG/5ML IV SOLN
INTRAVENOUS | Status: AC
  Filled 2015-04-04: qty 1

## 2015-04-04 MED ORDER — FENTANYL CITRATE (PF) 250 MCG/5ML IJ SOLN
INTRAMUSCULAR | Status: AC
  Filled 2015-04-04: qty 5

## 2015-04-04 MED ORDER — GLYCOPYRROLATE 0.2 MG/ML IJ SOLN
INTRAMUSCULAR | Status: DC | PRN
  Administered 2015-04-04: 0.6 mg via INTRAVENOUS

## 2015-04-04 MED ORDER — NEOSTIGMINE METHYLSULFATE 10 MG/10ML IV SOLN
INTRAVENOUS | Status: AC
  Filled 2015-04-04: qty 1

## 2015-04-04 MED ORDER — GLYCOPYRROLATE 0.2 MG/ML IJ SOLN
INTRAMUSCULAR | Status: AC
  Filled 2015-04-04: qty 3

## 2015-04-04 MED ORDER — PROPOFOL 10 MG/ML IV BOLUS
INTRAVENOUS | Status: DC | PRN
  Administered 2015-04-04: 180 mg via INTRAVENOUS

## 2015-04-04 MED ORDER — MEPERIDINE HCL 25 MG/ML IJ SOLN: 6.2500 mg | INTRAMUSCULAR | Status: DC | PRN

## 2015-04-04 MED ORDER — ONDANSETRON HCL 4 MG/2ML IJ SOLN
INTRAMUSCULAR | Status: AC
  Filled 2015-04-04: qty 2

## 2015-04-04 MED ORDER — LACTATED RINGERS IV SOLN
INTRAVENOUS | Status: DC
  Administered 2015-04-04: 09:00:00 via INTRAVENOUS

## 2015-04-04 MED ORDER — BUPIVACAINE-EPINEPHRINE (PF) 0.5% -1:200000 IJ SOLN
INTRAMUSCULAR | Status: AC
  Filled 2015-04-04: qty 3.6

## 2015-04-04 MED ORDER — FENTANYL CITRATE (PF) 100 MCG/2ML IJ SOLN
INTRAMUSCULAR | Status: DC | PRN
  Administered 2015-04-04: 150 ug via INTRAVENOUS

## 2015-04-04 MED ORDER — MIDAZOLAM HCL 2 MG/2ML IJ SOLN
INTRAMUSCULAR | Status: AC
  Filled 2015-04-04: qty 2

## 2015-04-04 MED ORDER — FENTANYL CITRATE (PF) 100 MCG/2ML IJ SOLN
100.0000 ug | Freq: Once | INTRAMUSCULAR | Status: AC
  Administered 2015-04-04 (×2): 50 ug via INTRAVENOUS

## 2015-04-04 MED ORDER — LIDOCAINE HCL (CARDIAC) 20 MG/ML IV SOLN
INTRAVENOUS | Status: DC | PRN
  Administered 2015-04-04: 80 mg via INTRAVENOUS

## 2015-04-04 SURGICAL SUPPLY — 34 items
ALCOHOL 70% 16 OZ (MISCELLANEOUS) ×3 IMPLANT
ATTRACTOMAT 16X20 MAGNETIC DRP (DRAPES) ×3 IMPLANT
BLADE SURG 15 STRL LF DISP TIS (BLADE) ×2 IMPLANT
BLADE SURG 15 STRL SS (BLADE) ×4
COVER SURGICAL LIGHT HANDLE (MISCELLANEOUS) ×3 IMPLANT
GAUZE PACKING FOLDED 2  STR (GAUZE/BANDAGES/DRESSINGS) ×2
GAUZE PACKING FOLDED 2 STR (GAUZE/BANDAGES/DRESSINGS) ×1 IMPLANT
GAUZE SPONGE 4X4 16PLY XRAY LF (GAUZE/BANDAGES/DRESSINGS) ×3 IMPLANT
GLOVE BIOGEL PI IND STRL 6 (GLOVE) ×1 IMPLANT
GLOVE BIOGEL PI INDICATOR 6 (GLOVE) ×2
GLOVE SURG ORTHO 8.0 STRL STRW (GLOVE) ×3 IMPLANT
GLOVE SURG SS PI 6.0 STRL IVOR (GLOVE) ×3 IMPLANT
GOWN STRL REUS W/ TWL LRG LVL3 (GOWN DISPOSABLE) ×1 IMPLANT
GOWN STRL REUS W/TWL 2XL LVL3 (GOWN DISPOSABLE) ×3 IMPLANT
GOWN STRL REUS W/TWL LRG LVL3 (GOWN DISPOSABLE) ×2
HEMOSTAT SURGICEL 2X14 (HEMOSTASIS) IMPLANT
KIT BASIN OR (CUSTOM PROCEDURE TRAY) ×3 IMPLANT
KIT ROOM TURNOVER OR (KITS) ×3 IMPLANT
MANIFOLD NEPTUNE WASTE (CANNULA) ×3 IMPLANT
NEEDLE BLUNT 16X1.5 OR ONLY (NEEDLE) ×3 IMPLANT
NS IRRIG 1000ML POUR BTL (IV SOLUTION) ×3 IMPLANT
PACK EENT II TURBAN DRAPE (CUSTOM PROCEDURE TRAY) ×3 IMPLANT
PAD ARMBOARD 7.5X6 YLW CONV (MISCELLANEOUS) ×3 IMPLANT
SPONGE SURGIFOAM ABS GEL 100 (HEMOSTASIS) IMPLANT
SPONGE SURGIFOAM ABS GEL 12-7 (HEMOSTASIS) IMPLANT
SPONGE SURGIFOAM ABS GEL SZ50 (HEMOSTASIS) IMPLANT
SUCTION FRAZIER TIP 10 FR DISP (SUCTIONS) ×3 IMPLANT
SUT CHROMIC 3 0 PS 2 (SUTURE) ×6 IMPLANT
SUT CHROMIC 4 0 P 3 18 (SUTURE) IMPLANT
SYR 50ML SLIP (SYRINGE) ×3 IMPLANT
TOWEL OR 17X26 10 PK STRL BLUE (TOWEL DISPOSABLE) ×3 IMPLANT
TUBE CONNECTING 12'X1/4 (SUCTIONS) ×1
TUBE CONNECTING 12X1/4 (SUCTIONS) ×2 IMPLANT
YANKAUER SUCT BULB TIP NO VENT (SUCTIONS) ×3 IMPLANT

## 2015-04-04 NOTE — H&P (Signed)
04/04/2015  Patient:            Terri Rios Date of Birth:  January 25, 1976 MRN:                [id number]   BP 139/80 mmHg  Pulse 91  Temp(Src) 98.8 F (37.1 C) (Oral)  Resp 20  Wt 203 lb (92.08 kg)  SpO2 100%  LMP 03/28/2015  Terri Rios presents for dental procedures in the OR with general anesthesia. Patient denies acute medical or dental changes. Facial selling has improved. Please use H and P from last admission for H and P for this dental procedure.  Dr. Kristin Bruins  H&P by Eduard Clos, MD at 03/31/2015 4:33 AM    Author: Eduard Clos, MD Service: Internal Medicine Author Type: Physician   Filed: 03/31/2015 6:49 AM Note Time: 03/31/2015 4:33 AM Status: Signed   Editor: Eduard Clos, MD (Physician)     Expand All Collapse All    [facility] History and Physical  Terri Rios WUJ:[id number] DOB: [birth year] DOA: 03/30/2015  Referring physician: Patient was transferred from [facility] [place]. PCP: No PCP Per Patient  Specialists: None.  Chief Complaint: Right-sided neck pain and mouth pain.  HPI: Terri Rios is a 40 y.o. female with history of asthma and tobacco abuse presents to the ER because of right-sided neck pain and mouth pain. Patient has been having this pain over the last few weeks. Patient had originally come to the ER the previous night and was given oral antibiotics for CAT scan was showing old cavity abscess. Despite taking which patient was having severe pain and had come to the ER. Patient has been admitted for further management. On my exam patient has no difficulty breathing but complains of mild difficulty swallowing. CT scan shows abscesses involving the oral cavity. Patient denies any fever or chills.   Review of Systems: As presented in the history of presenting illness, rest negative.  Past Medical History  Diagnosis Date  . Asthma    Past Surgical History  Procedure Laterality Date  . Knee surgery     . Hemorrhoid surgery     Social History:  reports that she has been smoking. She does not have any smokeless tobacco history on file. She reports that she does not drink alcohol or use illicit drugs. Where does patient live home. Can patient participate in ADLs? Yes.  Allergies  Allergen Reactions  . Morphine And Related Shortness Of Breath  . Iodine Rash    Family History:  Family History  Problem Relation Age of Onset  . Diabetes Mellitus II Mother   . Hypertension Father      Prior to Admission medications   Medication Sig Start Date End Date Taking? Authorizing Provider  albuterol (PROVENTIL HFA;VENTOLIN HFA) 108 (90 BASE) MCG/ACT inhaler Inhale into the lungs every 6 (six) hours as needed for wheezing or shortness of breath.    Historical Provider, MD  amoxicillin-clavulanate (AUGMENTIN) 875-125 MG tablet Take 1 tablet by mouth every 12 (twelve) hours. 03/29/15 04/05/15  Alvira Monday, MD  diazepam (VALIUM) 5 MG tablet Take 1 tablet (5 mg total) by mouth every 12 (twelve) hours as needed for anxiety or muscle spasms. 03/29/15   Alvira Monday, MD    Physical Exam: Filed Vitals:   03/30/15 2201 03/31/15 0031 03/31/15 0045 03/31/15 0048  BP: 122/99  120/68   Pulse: 78  70   Temp:   97.5 F (36.4 C)   TempSrc:  Oral   Resp: 18  18   Height:  5\' 2"  (1.575 m)    Weight:    92.307 kg (203 lb 8 oz)  SpO2: 99%  100%      General: Moderately built and nourished.  Eyes: Anicteric no pallor.  ENT: No discharge from the ears eyes nose normal. Has mild tenderness in the right jugulodigastric area.  Neck: Tenderness in the right jugulodigastric area. No neck rigidity.  Cardiovascular: S1 and S2 heard.  Respiratory: No rhonchi or crepitations.  Abdomen: Soft nontender bowel sounds present.  Skin: No rash.  Musculoskeletal: No edema.  Psychiatric: Appears  normal.  Neurologic: Alert and oriented to time place and person. Moves all extremities.  Labs on Admission:  Basic Metabolic Panel:  Last Labs      Recent Labs Lab 03/30/15 2135  NA 136  K 3.7  CL 106  CO2 24  GLUCOSE 111*  BUN 9  CREATININE 0.70  CALCIUM 9.1     Liver Function Tests:  Last Labs     No results for input(s): AST, ALT, ALKPHOS, BILITOT, PROT, ALBUMIN in the last 168 hours.    Last Labs     No results for input(s): LIPASE, AMYLASE in the last 168 hours.    Last Labs     No results for input(s): AMMONIA in the last 168 hours.   CBC:  Last Labs      Recent Labs Lab 03/30/15 2135  WBC 7.1  NEUTROABS 3.8  HGB 12.5  HCT 37.4  MCV 90.1  PLT 309     Cardiac Enzymes:  Last Labs     No results for input(s): CKTOTAL, CKMB, CKMBINDEX, TROPONINI in the last 168 hours.    BNP (last 3 results)  Recent Labs (within last 365 days)    No results for input(s): BNP in the last 8760 hours.    ProBNP (last 3 results)  Recent Labs (within last 365 days)    No results for input(s): PROBNP in the last 8760 hours.    CBG:  Last Labs     No results for input(s): GLUCAP in the last 168 hours.    Radiological Exams on Admission:  Imaging Results (Last 48 hours)    Ct Head Wo Contrast  03/29/2015 CLINICAL DATA: Headache with tenderness over right mastoid region. EXAM: CT HEAD WITHOUT CONTRAST TECHNIQUE: Contiguous axial images were obtained from the base of the skull through the vertex without intravenous contrast. COMPARISON: None. FINDINGS: The ventricles are normal in size and configuration. There does appear to be atrophy in the high frontal lobe regions bilaterally. There is no intracranial mass, hemorrhage, extra-axial fluid collection, or midline shift. The gray-white compartments are normal. Bony calvarium appears intact. The mastoids on the left are clear. 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 No intraorbital  lesions are identified. There is mild mucosal thickening in the anterior ethmoid air cells bilaterally. IMPRESSION: High frontal atrophy bilaterally. Ventricles are normal in size and configuration. No intracranial mass, hemorrhage, focal gray - white compartment lesion. 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 There is mucosal thickening in several anterior ethmoid air cells bilaterally. Mastoids on the left are clear. Electronically Signed By: Bretta Bang III M.D. On: 03/29/2015 09:55   Ct Soft Tissue Neck Wo Contrast  03/29/2015 CLINICAL DATA: Right-sided head and neck pain. Jaw pain with trismus. EXAM: CT NECK WITHOUT CONTRAST TECHNIQUE: Multidetector CT imaging of the neck was performed following the standard protocol without intravenous contrast. Patient  has history of contrast allergy. A cauda COMPARISON: None. FINDINGS: Evaluation of the soft tissues is suboptimal in the absence of IV contrast. There is significant periodontal disease affecting the mandibular teeth. Multiple teeth are missing, presumably extracted for periodontal disease. There is an oral cavity abscess adjacent to the medial angle of mandible on the RIGHT, 8 x 11 mm cross-section with a small bubble of air. New new see for instance image 44 series 4. No definite extension to the submandibular or sublingual space but characterization is made difficult in the absence of IV contrast. Reactive BILATERAL RIGHT greater than LEFT level 1 and level 2 lymph nodes. No mandibular osteomyelitis is detected. No acute sinus disease. Negative orbits. Airway midline. Negative visualized intracranial compartment. No lung apex lesion. No neck masses. IMPRESSION: BILATERAL mandibular periodontal disease with a suspected RIGHT oral cavity abscess 8 x 11 mm cross-section, adjacent to the medial angle of mandible. Reactive cervical adenopathy. Within limits for assessment on noncontrast exam, no definite extension to the submandibular or  sublingual space on the RIGHT. Recommend correlation with physical exam. Oral surgical or ENT consultation may be warranted. Electronically Signed By: Elsie StainJohn T Curnes M.D. On: 03/29/2015 10:08      Assessment/Plan Principal Problem:  Abscess of oral space Active Problems:  Neck pain  Oral abscess   1. Abscess of the oral cavity - I have discussed along already surgeon Dr. Dwain SarnaWolickki who at this time is advised to get oral surgeon consult and if not possible to reconsult Dr. Dwain SarnaWolickki. Patient has been placed nothing by mouth with IV antibiotics. Follow cultures. 2. Asthma presently not wheezing continue when necessary albuterol.  3. Tobacco abuse - patient advised to quit smoking.   DVT ProphylaxisSCDs.  Code Status: Full code.  Family Communication: Discussed with patient.  Disposition Plan: Admit to inpatient.   KAKRAKANDY,ARSHAD N. [facility] Pager [phone].  If 7PM-7AM, please contact night-coverage [email or web address] Password [email or web address] 03/31/2015, 4:33 AM

## 2015-04-04 NOTE — Transfer of Care (Signed)
Immediate Anesthesia Transfer of Care Note  Patient: Terri MaresYolanda Rios  Procedure(s) Performed: Procedure(s): EXTRACTION OF TOOTH #'S 19,29, 30 WITH ALVEOLOPLASTY (Bilateral)  Patient Location: PACU  Anesthesia Type:General  Level of Consciousness: sedated  Airway & Oxygen Therapy: Patient Spontanous Breathing and Patient connected to nasal cannula oxygen  Post-op Assessment: Report given to RN and Post -op Vital signs reviewed and stable  Post vital signs: Reviewed and stable  Last Vitals:  Filed Vitals:   04/04/15 0725 04/04/15 1008  BP:  114/60  Pulse: 91 82  Temp:  36.7 C  Resp: 20 21    Complications: No apparent anesthesia complications

## 2015-04-04 NOTE — Progress Notes (Signed)
Patient stated that she needed to urinate.  Patient received 100 mcg of Fentanyl and unable to ambulate to bathroom.  Patient offered bedpan and assistance but patient refused.

## 2015-04-04 NOTE — Progress Notes (Signed)
PRE-OPERATIVE NOTE:  04/04/2015 Terri MaresYolanda Rios [id number]  VITALS: BP 139/80 mmHg  Pulse 91  Temp(Src) 98.8 F (37.1 C) (Oral)  Resp 20  Wt 203 lb (92.08 kg)  SpO2 100%  LMP 03/28/2015  Lab Results  Component Value Date   WBC 5.5 03/31/2015   HGB 12.4 03/31/2015   HCT 36.0 03/31/2015   MCV 90.5 03/31/2015   PLT 269 03/31/2015   BMET    Component Value Date/Time   NA 138 03/31/2015 0600   K 4.1 03/31/2015 0600   CL 106 03/31/2015 0600   CO2 23 03/31/2015 0600   GLUCOSE 96 03/31/2015 0600   BUN 7 03/31/2015 0600   CREATININE 0.64 03/31/2015 0600   CALCIUM 8.9 03/31/2015 0600   GFRNONAA >60 03/31/2015 0600   GFRAA >60 03/31/2015 0600    No results found for: INR, PROTIME No results found for: PTT   Terri MaresYolanda Rios presents for multiple dental extractions with alveoloplasty in the operating room and general anesthesia.  Plan will be to extract tooth numbers 19, 29, and 30 with alveoloplasty with consideration of other dental extractions as indicated approximately evaluation the operating room.   SUBJECTIVE: The patient denies any acute medical or dental changes and agrees to proceed with treatment as planned.  EXAM: No sign of acute dental changes.  ASSESSMENT: Patient is affected by history of right facial swelling, chronic apical periodontitis, dental caries, and chronic periodontitis.  PLAN: Patient agrees to proceed with treatment as planned in the operating room as previously discussed and accepts the risks, benefits, and complications of the proposed treatment. Patient is aware of the risk for bleeding, bruising, swelling, infection, pain, nerve damage, damage to adjacent teeth, sinus involvement, root tip fracture, mandible fracture, and the risks of complications associated with the anesthesia. Patient also is aware of the potential for other complications not mentioned above. Patient also is aware of the need for follow-up evaluation and dental treatment is  indicated since she did not wish to proceed with extraction of maxillary anterior teeth with dental caries at this time.   Terri Rios, DDS

## 2015-04-04 NOTE — Op Note (Signed)
OPERATIVE REPORT  Patient:            Terri Rios Date of Birth:  [birth year] MRN:                [id number]   DATE OF PROCEDURE:  04/04/2015  PREOPERATIVE DIAGNOSES: 1. History of right facial swelling 2. Acute pulpitis 3. Chronic apical periodontitis 4. Dental caries  POSTOPERATIVE DIAGNOSES: 1. History of right facial swelling 2. Acute pulpitis 3. Chronic apical periodontitis 4. Dental caries  OPERATIONS: 1. Multiple extraction of tooth numbers 19, 29, and 30 2. Two Quadrants of alveoloplasty   SURGEON: Charlynne Panderonald F. Jaylene Schrom, DDS  ASSISTANT: Rory PercyLisa Ingram, (dental assistant)  ANESTHESIA: General anesthesia via oral endotracheal tube.  MEDICATIONS: 1. Ancef 2 g IV prior to invasive dental procedures. 2. Local anesthesia with a total utilization of 2 carpules each containing 34 mg of lidocaine with 0.017 mg of epinephrine as well as 2 carpules each containing 9 mg of bupivacaine with 0.009 mg of epinephrine.  SPECIMENS: There are 3 teeth that were discarded.  DRAINS: None  CULTURES: None  COMPLICATIONS: None   ESTIMATED BLOOD LOSS: 25 mLs.  INTRAVENOUS FLUIDS: 800 mLs of Lactated ringers solution.  INDICATIONS: The patient was recently diagnosed with right facial swelling.  A dental consultation was then requested to evaluate for dental etiology.  The patient was examined and treatment planned for multiple extractions with alveoloplasty with general anesthesia in the OR.  This treatment plan was formulated to decrease the risks and complications associated with dental infection from further affecting the patient's systemic health.  OPERATIVE FINDINGS: Patient was examined in operating room number 9.  Tooth numbers 19, 29, and 30 were identified for extraction. The patient refused extraction of tooth numbers 7, 8, 9 at this time and will follow-up with the general dentist of her choice for exam, additional dental radiographs, and discussion of other dental treatment  needs at that time. The patient was noted be affected by history of right facial swelling, chronic apical periodontitis, multiple dental caries, history of acute pulpitis.    DESCRIPTION OF PROCEDURE: Patient was brought to the main operating room number 9. Patient was then placed in the supine position on the operating table. General anesthesia was then induced per the anesthesia team. The patient was then prepped and draped in the usual manner for dental medicine procedure. A timeout was performed. The patient was identified and procedures were verified. A throat pack was placed at this time. The oral cavity was then thoroughly examined with the findings noted above. The patient was then ready for dental medicine procedure as follows:  Local anesthesia was then administered sequentially with a total utilization of 2 carpules each containing 34 mg of lidocaine with 0.017 mg of epinephrine as well as 2 carpules  each containing 9 mg bupivacaine with 0.009 mg of epinephrine.  At this point time, the mandibular quadrants were approached. The patient was given bilateral inferior alveolar nerve blocks and long buccal nerve blocks utilizing the bupivacaine with epinephrine. Further infiltration was then achieved utilizing the lidocaine with epinephrine. A 15 blade incision was then made from the distal of number 32 and extended to the mesial of #28.  A surgical flap was then carefully reflected. Tooth numbers 29 and 30 were then subluxated with a series straight elevators. The coronal aspect of tooth #30 was then removed with a 23 forceps leaving roots remaining. Tooth #29 removed with a 151 forceps without complications. Appropriate amounts of buccal and interseptal  bone were then removed around retained roots #30 utilizing a surgical handpiece and copious amount of sterile water. The remaining roots were subluxated and removed with a series of cryers elevators without further complication. Alveoloplasty was  then performed utilizing a rongeur and bone file. The tissues were approximated and trimmed appropriately. The surgical site was irrigated with copious amounts of sterile saline. The surgical site was then closed from the distal of #32 and extended to the distal of #28 utilizing 3-0 chromic gut suture in a continuous interrupted suture technique 1. An interproximal suture was placed between tooth numbers 27 and 28 utilizing 3-0 chromic gut material.   At this point time the mandibular left surgical site was approached. A 15 blade incision was then made from the distal of #18 and extended to the mesial of #20. A surgical flap was then carefully reflected. Tooth #19 was then subluxated with a series straight elevators. The mesial root and crown was then removed with a 151 forceps leaving the distal root remaining. Appropriate amounts of buccal and interseptal bone was removed around retained root of #19 with a surgical handpiece and bur and copious amounts of sterile water. The root was then elevated and removed with a series of cryers elevators without complication. Alveoloplasty was then performed utilizing a rongeur and bone file. The tissues were approximated and trimmed appropriately. The surgical sites were then irrigated with copious amounts of sterile saline. The mandibular left surgical site was then closed from the distal of 18 and extended to the distal of #20 utilizing 3-0 chromic gut suture in a continuous interrupted suture technique 1. An interproximal suture was then placed between tooth numbers 20 and 21 utilizing 3-0 chromic gut material.   At this point time, the entire mouth was irrigated with copious amounts of sterile saline. The patient was examined for complications, seeing none, the dental medicine procedure was deemed to be complete. The throat pack was removed at this time. An oral airway was then placed at the request of the anesthesia team. A series of 4 x 4 gauze were placed in the  mouth to aid hemostasis. The patient was then handed over to the anesthesia team for final disposition. After an appropriate amount of time, the patient was extubated and taken to the postanesthsia care unit in good condition. All counts were correct for the dental medicine procedure. Patient is to return to dental medicine in 7-10 days for evaluation for suture removal. Patient is to continue on oral antibiotic therapy per medical team discharge instructions.   Charlynne Pander, DDS.

## 2015-04-04 NOTE — Anesthesia Procedure Notes (Signed)
Procedure Name: Intubation Date/Time: 04/04/2015 8:59 AM Performed by: Margaree MackintoshYACOUB, Milia Warth B Pre-anesthesia Checklist: Patient identified, Emergency Drugs available, Suction available, Patient being monitored and Timeout performed Patient Re-evaluated:Patient Re-evaluated prior to inductionOxygen Delivery Method: Circle system utilized Preoxygenation: Pre-oxygenation with 100% oxygen Intubation Type: IV induction Ventilation: Mask ventilation without difficulty Laryngoscope Size: Mac and 3 Grade View: Grade I Tube type: Oral Tube size: 7.0 mm Number of attempts: 1 Airway Equipment and Method: Stylet Placement Confirmation: ETT inserted through vocal cords under direct vision,  positive ETCO2 and breath sounds checked- equal and bilateral Secured at: 21 cm Tube secured with: Tape

## 2015-04-04 NOTE — Discharge Instructions (Signed)

## 2015-04-04 NOTE — Anesthesia Postprocedure Evaluation (Signed)
Anesthesia Post Note  Patient: Collene MaresYolanda Branca  Procedure(s) Performed: Procedure(s) (LRB): EXTRACTION OF TOOTH #'S 19,29, 30 WITH ALVEOLOPLASTY (Bilateral)  Patient location during evaluation: PACU Anesthesia Type: General Level of consciousness: awake and alert Pain management: pain level controlled Vital Signs Assessment: post-procedure vital signs reviewed and stable Respiratory status: spontaneous breathing, nonlabored ventilation, respiratory function stable and patient connected to nasal cannula oxygen Cardiovascular status: blood pressure returned to baseline and stable Postop Assessment: no signs of nausea or vomiting Anesthetic complications: no    Last Vitals:  Filed Vitals:   04/04/15 1045 04/04/15 1110  BP: 124/67 105/76  Pulse: 70 75  Temp:  36.6 C  Resp: 18 16    Last Pain:  Filed Vitals:   04/04/15 1114  PainSc: 5                  Tylan Kinn A.

## 2015-04-05 ENCOUNTER — Encounter (HOSPITAL_COMMUNITY): Payer: Self-pay | Admitting: Dentistry

## 2015-04-05 LAB — CULTURE, BLOOD (ROUTINE X 2)
Culture: NO GROWTH
Culture: NO GROWTH

## 2015-04-12 ENCOUNTER — Encounter (HOSPITAL_COMMUNITY): Payer: Self-pay | Admitting: Dentistry

## 2015-04-12 ENCOUNTER — Encounter (INDEPENDENT_AMBULATORY_CARE_PROVIDER_SITE_OTHER): Payer: Self-pay

## 2015-04-12 ENCOUNTER — Ambulatory Visit (HOSPITAL_COMMUNITY): Payer: Self-pay | Admitting: Dentistry

## 2015-04-12 VITALS — BP 118/75 | HR 94 | Temp 98.8°F

## 2015-04-12 DIAGNOSIS — K08409 Partial loss of teeth, unspecified cause, unspecified class: Secondary | ICD-10-CM

## 2015-04-12 DIAGNOSIS — R220 Localized swelling, mass and lump, head: Secondary | ICD-10-CM

## 2015-04-12 DIAGNOSIS — K045 Chronic apical periodontitis: Secondary | ICD-10-CM

## 2015-04-12 DIAGNOSIS — K029 Dental caries, unspecified: Secondary | ICD-10-CM

## 2015-04-12 DIAGNOSIS — K0401 Reversible pulpitis: Secondary | ICD-10-CM

## 2015-04-12 DIAGNOSIS — M264 Malocclusion, unspecified: Secondary | ICD-10-CM

## 2015-04-12 NOTE — Progress Notes (Signed)
POST OPERATIVE NOTE:  04/12/2015 Terri Rios [id number]  VITALS: BP 118/75 mmHg  Pulse 94  Temp(Src) 98.8 F (37.1 C) (Oral)  LMP 03/28/2015  LABS:  Lab Results  Component Value Date   WBC 5.5 03/31/2015   HGB 12.4 03/31/2015   HCT 36.0 03/31/2015   MCV 90.5 03/31/2015   PLT 269 03/31/2015   BMET    Component Value Date/Time   NA 138 03/31/2015 0600   K 4.1 03/31/2015 0600   CL 106 03/31/2015 0600   CO2 23 03/31/2015 0600   GLUCOSE 96 03/31/2015 0600   BUN 7 03/31/2015 0600   CREATININE 0.64 03/31/2015 0600   CALCIUM 8.9 03/31/2015 0600   GFRNONAA >60 03/31/2015 0600   GFRAA >60 03/31/2015 0600    No results found for: INR, PROTIME No results found for: PTT   Terri Rios is status post extraction of tooth numbers 19, 29, and 30 with alveoloplasty in the operating with general anesthesia on 04/04/2015. Patient now presents for evaluation of healing and suture removal as indicated.  SUBJECTIVE: Patient with minimal oral complaints. Patient indicates that stitches are still present. Patient is still complaining of persistent muscle spasms of her right neck and shoulder.   EXAM: There is no sign of oral infection, heme, or ooze. Sutures are loosely intact. Generalized primary closure is noted. Additional dental caries are noted as per previous discussion with the patient.  PROCEDURE: The patient was given a chlorhexidine gluconate rinse for 30 seconds. Sutures were then removed without complication. Patient tolerated the procedure well.  ASSESSMENT: Post operative course is consistent with dental procedures performed in the operating of a general anesthesia. Dental caries Multiple missing teeth Need for comprehensive dental care.  PLAN: 1. Use salt water rinses as needed to aid healing. 2. Continue oral antibiotic therapy as instructed. Consider stopping oral antibiotics if diarrhea appears. 3. Follow-up with the general dentist of her choice for exam,  radiographs, and discussion of other dental treatment needs.   Charlynne Pander, DDS

## 2015-04-12 NOTE — Patient Instructions (Addendum)
1. Use salt water rinses as needed to aid healing. 2. Continue oral antibiotic therapy as per instructions from physicians. Consider stopping antibiotics if diarrhea appears. 3. Follow-up with general dentist of her choice for exam, radiographs, and discussion of other dental treatment needs. Charlynne Pander, DDS

## 2015-04-18 ENCOUNTER — Ambulatory Visit: Payer: Self-pay | Admitting: Family Medicine

## 2016-04-26 ENCOUNTER — Emergency Department (HOSPITAL_BASED_OUTPATIENT_CLINIC_OR_DEPARTMENT_OTHER)
Admission: EM | Admit: 2016-04-26 | Discharge: 2016-04-26 | Disposition: A | Discharge status: Home / Self Care | Payer: Self-pay | Attending: Emergency Medicine | Admitting: Emergency Medicine

## 2016-04-26 ENCOUNTER — Encounter (HOSPITAL_BASED_OUTPATIENT_CLINIC_OR_DEPARTMENT_OTHER): Payer: Self-pay

## 2016-04-26 ENCOUNTER — Emergency Department (HOSPITAL_BASED_OUTPATIENT_CLINIC_OR_DEPARTMENT_OTHER): Payer: Self-pay

## 2016-04-26 DIAGNOSIS — K0889 Other specified disorders of teeth and supporting structures: Secondary | ICD-10-CM

## 2016-04-26 DIAGNOSIS — K029 Dental caries, unspecified: Secondary | ICD-10-CM | POA: Insufficient documentation

## 2016-04-26 DIAGNOSIS — R05 Cough: Secondary | ICD-10-CM | POA: Insufficient documentation

## 2016-04-26 DIAGNOSIS — R059 Cough, unspecified: Secondary | ICD-10-CM

## 2016-04-26 DIAGNOSIS — F17200 Nicotine dependence, unspecified, uncomplicated: Secondary | ICD-10-CM | POA: Insufficient documentation

## 2016-04-26 DIAGNOSIS — Z79899 Other long term (current) drug therapy: Secondary | ICD-10-CM | POA: Insufficient documentation

## 2016-04-26 DIAGNOSIS — J45909 Unspecified asthma, uncomplicated: Secondary | ICD-10-CM | POA: Insufficient documentation

## 2016-04-26 MED ORDER — ALBUTEROL SULFATE HFA 108 (90 BASE) MCG/ACT IN AERS: 1.0000 | INHALATION_SPRAY | Freq: Four times a day (QID) | RESPIRATORY_TRACT | 0 refills | Status: DC | PRN

## 2016-04-26 MED ORDER — METHOCARBAMOL 500 MG PO TABS: 500.0000 mg | ORAL_TABLET | Freq: Two times a day (BID) | ORAL | 0 refills | Status: DC

## 2016-04-26 MED ORDER — METHOCARBAMOL 500 MG PO TABS
1000.0000 mg | ORAL_TABLET | Freq: Once | ORAL | Status: AC
  Administered 2016-04-26: 1000 mg via ORAL
  Filled 2016-04-26: qty 2

## 2016-04-26 MED ORDER — PENICILLIN V POTASSIUM 500 MG PO TABS: 500.0000 mg | ORAL_TABLET | Freq: Four times a day (QID) | ORAL | 0 refills | Status: AC

## 2016-04-26 MED ORDER — IBUPROFEN 800 MG PO TABS
800.0000 mg | ORAL_TABLET | Freq: Once | ORAL | Status: AC
  Administered 2016-04-26: 800 mg via ORAL
  Filled 2016-04-26: qty 1

## 2016-04-26 MED ORDER — IBUPROFEN 800 MG PO TABS: 800.0000 mg | ORAL_TABLET | Freq: Three times a day (TID) | ORAL | 0 refills | Status: DC

## 2016-04-26 NOTE — ED Notes (Signed)
ED Provider at bedside. 

## 2016-04-26 NOTE — ED Triage Notes (Signed)
Also c/o left upper toothache x 2 days

## 2016-04-26 NOTE — Discharge Instructions (Signed)
Medications: Penicillin, ibuprofen, Robaxin, albuterol inhaler  Treatment: Take penicillin 4 times daily as prescribed for 1 week. Make sure to finish all this medication. Take ibuprofen every 8 hours as needed for your pain. You can alternate with Tylenol as prescribed over-the-counter. Take Robaxin twice daily as needed for muscle pain and spasms. Do not drive or operate machinery when taking this medication. Use albuterol inhaler every 4-6 hours as needed for shortness of breath, cough, or wheezing. It would be helpful for your overall health and to get rid of your cough if you are able to stop smoking.  Follow-up: Please follow-up with your dentist as soon as possible for further evaluation and treatment. Please follow-up and establish care with a primary care provider by calling the number circled on your discharge paperwork. Please return to the emergency department if you develop any new or worsening symptoms.

## 2016-04-26 NOTE — ED Provider Notes (Signed)
[facility] Provider Note   CSN: [id number] Arrival date & time: 04/26/16  1545  By signing my name below, I, Terri Rios, attest that this documentation has been prepared under the direction and in the presence of Terri Rios Clover, PA-C. Electronically Signed: Garen Lah, Scribe. 04/26/2016. 5:00 PM.   History   Chief Complaint Chief Complaint  Patient presents with  . Cough  . Dental Pain   The history is provided by the patient. No language interpreter was used.    HPI Comments:  Terri Rios is a 41 y.o. female with a PMHx of asthma, who presents to the Emergency Department complaining of persistent, dental pain onset yesterday. She notes associated URI like symptoms that have persisted for three weeks. She also has associated symptoms of a resolved fever (TMax 101.3), chills, productive cough, HA, and left-sided shoulder pain both beginning yesterday. She reports taking OTC cough medicine and 200mg  Ibuprofen at home with minimal relief of her symptoms. She reports a past h/o bronchitis but notes her symptoms today feel different. Per pt, she uses an inhaler with spacer at home with mild relief of her symptoms. Additionally, she notes recently finishing her last prescribed steroid tx 3 weeks ago. She denies SOB, abdominal pain, nausea, vomiting, and any other associated symptoms at this time. Pt is a current every day smoker.   Past Medical History:  Diagnosis Date  . Anxiety   . Asthma   . Carpal tunnel syndrome of right wrist   . Headache    migraines  . Kidney stones   . Muscle spasms of neck   . Pneumonia     Patient Active Problem List   Diagnosis Date Noted  . Dental caries 04/04/2015  . Neck pain 03/30/2015    Past Surgical History:  Procedure Laterality Date  . HEMORRHOID SURGERY    . KNEE SURGERY Right   . MULTIPLE EXTRACTIONS WITH ALVEOLOPLASTY Bilateral 04/04/2015   Procedure: EXTRACTION OF TOOTH #'S 19,29, 30 WITH ALVEOLOPLASTY;  Surgeon: Charlynne Pander, DDS;  Location: [facility] OR;  Service: Oral Surgery;  Laterality: Bilateral;    OB History    No data available       Home Medications    Prior to Admission medications   Medication Sig Start Date End Date Taking? Authorizing Provider  DICLOFENAC PO Take by mouth.   Yes Historical Provider, MD  albuterol (PROVENTIL HFA;VENTOLIN HFA) 108 (90 Base) MCG/ACT inhaler Inhale 1-2 puffs into the lungs every 6 (six) hours as needed for wheezing or shortness of breath. 04/26/16   Emi Holes, PA-C  ibuprofen (ADVIL,MOTRIN) 800 MG tablet Take 1 tablet (800 mg total) by mouth 3 (three) times daily. 04/26/16   Emi Holes, PA-C  methocarbamol (ROBAXIN) 500 MG tablet Take 1 tablet (500 mg total) by mouth 2 (two) times daily. 04/26/16   Emi Holes, PA-C  penicillin v potassium (VEETID) 500 MG tablet Take 1 tablet (500 mg total) by mouth 4 (four) times daily. 04/26/16 05/03/16  Emi Holes, PA-C    Family History Family History  Problem Relation Age of Onset  . Diabetes Mellitus II Mother   . Hypertension Mother   . Hypertension Father   . Diabetes Mellitus II Father     Social History Social History  Substance Use Topics  . Smoking status: Current Every Day Smoker    Packs/day: 1.00  . Smokeless tobacco: Never Used  . Alcohol use No     Allergies  Morphine and related and Iodine   Review of Systems Review of Systems  Constitutional: Positive for chills and fever (resolved).  HENT: Positive for dental problem. Negative for facial swelling and sore throat.   Respiratory: Positive for cough. Negative for shortness of breath.   Cardiovascular: Negative for chest pain.  Gastrointestinal: Negative for abdominal pain, nausea and vomiting.  Genitourinary: Negative for dysuria.  Musculoskeletal: Positive for myalgias. Negative for back pain.  Skin: Negative for rash and wound.  Neurological: Positive for headaches.  Psychiatric/Behavioral: The patient is not nervous/anxious.       Physical Exam Updated Vital Signs BP 116/70 (BP Location: Right Arm)   Pulse 77   Temp 98.8 F (37.1 C) (Oral)   Resp 18   Ht 5\' 2"  (1.575 m)   Wt 96.6 kg   LMP 04/14/2016   SpO2 100%   BMI 38.96 kg/m   Physical Exam  Constitutional: She appears well-developed and well-nourished. No distress.  HENT:  Head: Normocephalic and atraumatic.  Mouth/Throat: Oropharynx is clear and moist. No trismus in the jaw. Abnormal dentition. Dental caries present. No dental abscesses. No oropharyngeal exudate, posterior oropharyngeal edema, posterior oropharyngeal erythema or tonsillar abscesses.    Eyes: Conjunctivae are normal. Pupils are equal, round, and reactive to light. Right eye exhibits no discharge. Left eye exhibits no discharge. No scleral icterus.  Neck: Normal range of motion. Neck supple. No thyromegaly present.  Cardiovascular: Normal rate, regular rhythm, normal heart sounds and intact distal pulses.  Exam reveals no gallop and no friction rub.   No murmur heard. Pulmonary/Chest: Effort normal and breath sounds normal. No stridor. No respiratory distress. She has no wheezes. She has no rales.  Abdominal: Soft. Bowel sounds are normal. She exhibits no distension. There is no tenderness. There is no rebound and no guarding.  Musculoskeletal: She exhibits no edema.  Left upper trapezius tenderness; no other tenderness of the left upper extremity  Lymphadenopathy:    She has no cervical adenopathy.  Neurological: She is alert. Coordination normal.  Normal sensation to her upper extremities, equal bilateral grip strength, 5/5 strength to upper extremities  Skin: Skin is warm and dry. No rash noted. She is not diaphoretic. No pallor.  Psychiatric: She has a normal mood and affect.  Nursing note and vitals reviewed.    ED Treatments / Results  DIAGNOSTIC STUDIES:  Oxygen Saturation is 100% on RA, normal by my interpretation.    COORDINATION OF CARE:  4:45 PM Discussed  treatment plan with pt including Abx, muscle relaxer, and CXR at bedside and pt agreed to plan.  Labs (all labs ordered are listed, but only abnormal results are displayed) Labs Reviewed - No data to display  EKG  EKG Interpretation None       Radiology Dg Chest 2 View  Result Date: 04/26/2016 CLINICAL DATA:  Persisting cough for 3 weeks.  Current smoker. EXAM: CHEST  2 VIEW COMPARISON:  None. FINDINGS: Normal heart size. Normal mediastinal contour. No pneumothorax. No pleural effusion. Lungs appear clear, with no acute consolidative airspace disease and no pulmonary edema. IMPRESSION: No active cardiopulmonary disease. Electronically Signed   By: Delbert Phenix M.D.   On: 04/26/2016 17:16    Procedures Procedures (including critical care time)  Medications Ordered in ED Medications  ibuprofen (ADVIL,MOTRIN) tablet 800 mg (800 mg Oral Given 04/26/16 1806)  methocarbamol (ROBAXIN) tablet 1,000 mg (1,000 mg Oral Given 04/26/16 1806)     Initial Impression / Assessment and Plan / ED Course  I have reviewed the triage vital signs and the nursing notes.  Pertinent labs & imaging results that were available during my care of the patient were reviewed by me and considered in my medical decision making (see chart for details).     Patient with dentalgia.  No abscess requiring immediate incision and drainage.  Exam not concerning for Ludwig's angina or pharyngeal abscess.  Will treat with Penicillin, ibuprofen. Pt instructed to follow-up with dentist as soon as possible. Patient also with most likely bronchitis. CXR shows no active cardiopulmonary disease. Patient advised to quit smoking, as this would help her bronchitis resolve. Patient given albuterol inhaler. Patient's left shoulder and arm pain most likely due to tension in left upper trapezius, potentially caused by dental pain. Patient with full range of motion and normal sensation and strength. No signs of emergent cause of arm pain.  Radial pulses intact. Discharge home with Robaxin. Discussed return precautions. Patient vitals stable throughout ED course and discharged in satisfactory condition.   Final Clinical Impressions(s) / ED Diagnoses   Final diagnoses:  Cough  Pain, dental    New Prescriptions Discharge Medication List as of 04/26/2016  6:04 PM    START taking these medications   Details  ibuprofen (ADVIL,MOTRIN) 800 MG tablet Take 1 tablet (800 mg total) by mouth 3 (three) times daily., Starting Thu 04/26/2016, Print    methocarbamol (ROBAXIN) 500 MG tablet Take 1 tablet (500 mg total) by mouth 2 (two) times daily., Starting Thu 04/26/2016, Print    penicillin v potassium (VEETID) 500 MG tablet Take 1 tablet (500 mg total) by mouth 4 (four) times daily., Starting Thu 04/26/2016, Until Thu 05/03/2016, Print       I personally performed the services described in this documentation, which was scribed in my presence. The recorded information has been reviewed and is accurate.     Emi Holeslexandra M Betsaida [place], PA-C 04/27/16 1600    Rolan BuccoMelanie Belfi, MD 04/27/16 Silva [id number]

## 2016-04-26 NOTE — ED Triage Notes (Signed)
C/o prod cough x 3 days-NAD-steady gait

## 2016-08-04 ENCOUNTER — Encounter (HOSPITAL_BASED_OUTPATIENT_CLINIC_OR_DEPARTMENT_OTHER): Payer: Self-pay

## 2016-08-04 ENCOUNTER — Emergency Department (HOSPITAL_BASED_OUTPATIENT_CLINIC_OR_DEPARTMENT_OTHER)
Admission: EM | Admit: 2016-08-04 | Discharge: 2016-08-04 | Disposition: A | Discharge status: Home / Self Care | Payer: BLUE CROSS/BLUE SHIELD | Attending: Emergency Medicine | Admitting: Emergency Medicine

## 2016-08-04 ENCOUNTER — Encounter (HOSPITAL_BASED_OUTPATIENT_CLINIC_OR_DEPARTMENT_OTHER): Payer: Self-pay | Admitting: *Deleted

## 2016-08-04 ENCOUNTER — Emergency Department (HOSPITAL_BASED_OUTPATIENT_CLINIC_OR_DEPARTMENT_OTHER)
Admission: EM | Admit: 2016-08-04 | Discharge: 2016-08-05 | Disposition: A | Discharge status: Home / Self Care | Payer: Self-pay | Attending: Emergency Medicine | Admitting: Emergency Medicine

## 2016-08-04 DIAGNOSIS — F17200 Nicotine dependence, unspecified, uncomplicated: Secondary | ICD-10-CM | POA: Insufficient documentation

## 2016-08-04 DIAGNOSIS — K0889 Other specified disorders of teeth and supporting structures: Secondary | ICD-10-CM

## 2016-08-04 DIAGNOSIS — K0381 Cracked tooth: Secondary | ICD-10-CM | POA: Insufficient documentation

## 2016-08-04 DIAGNOSIS — Z79899 Other long term (current) drug therapy: Secondary | ICD-10-CM | POA: Insufficient documentation

## 2016-08-04 DIAGNOSIS — I10 Essential (primary) hypertension: Secondary | ICD-10-CM | POA: Insufficient documentation

## 2016-08-04 DIAGNOSIS — K029 Dental caries, unspecified: Secondary | ICD-10-CM | POA: Insufficient documentation

## 2016-08-04 DIAGNOSIS — J45909 Unspecified asthma, uncomplicated: Secondary | ICD-10-CM | POA: Insufficient documentation

## 2016-08-04 HISTORY — DX: Essential (primary) hypertension: I10

## 2016-08-04 MED ORDER — NAPROXEN 500 MG PO TABS: 500.0000 mg | ORAL_TABLET | Freq: Two times a day (BID) | ORAL | 0 refills | Status: DC

## 2016-08-04 MED ORDER — KETOROLAC TROMETHAMINE 60 MG/2ML IM SOLN
30.0000 mg | Freq: Once | INTRAMUSCULAR | Status: AC
  Administered 2016-08-04: 30 mg via INTRAMUSCULAR
  Filled 2016-08-04: qty 2

## 2016-08-04 MED ORDER — CLINDAMYCIN HCL 150 MG PO CAPS
300.0000 mg | ORAL_CAPSULE | Freq: Once | ORAL | Status: AC
Start: 2016-08-04 — End: 2016-08-04
  Administered 2016-08-04: 300 mg via ORAL
  Filled 2016-08-04: qty 2

## 2016-08-04 MED ORDER — OXYCODONE-ACETAMINOPHEN 5-325 MG PO TABS
1.0000 | ORAL_TABLET | Freq: Once | ORAL | Status: AC
  Administered 2016-08-04: 1 via ORAL
  Filled 2016-08-04: qty 1

## 2016-08-04 MED ORDER — PENICILLIN V POTASSIUM 250 MG PO TABS: 500.0000 mg | ORAL_TABLET | Freq: Once | ORAL | Status: DC

## 2016-08-04 MED ORDER — OXYCODONE-ACETAMINOPHEN 5-325 MG PO TABS: 1.0000 | ORAL_TABLET | Freq: Four times a day (QID) | ORAL | 0 refills | Status: DC | PRN

## 2016-08-04 MED ORDER — CLINDAMYCIN HCL 150 MG PO CAPS: 300.0000 mg | ORAL_CAPSULE | Freq: Four times a day (QID) | ORAL | 0 refills | Status: DC

## 2016-08-04 MED ORDER — CLINDAMYCIN HCL 150 MG PO CAPS
300.0000 mg | ORAL_CAPSULE | Freq: Once | ORAL | Status: AC
  Administered 2016-08-05: 300 mg via ORAL
  Filled 2016-08-04: qty 2

## 2016-08-04 MED ORDER — METHOCARBAMOL 500 MG PO TABS
1000.0000 mg | ORAL_TABLET | Freq: Once | ORAL | Status: AC
  Administered 2016-08-04: 1000 mg via ORAL
  Filled 2016-08-04: qty 2

## 2016-08-04 NOTE — ED Notes (Signed)
ED Provider at bedside. 

## 2016-08-04 NOTE — ED Triage Notes (Signed)
Reports dental pain is so severe and couldn't get medication filled and BP was high when she left here and is now elevated to 150s SBP

## 2016-08-04 NOTE — ED Provider Notes (Addendum)
[facility] Provider Note   CSN: [id number] Arrival date & time: 08/04/16  2315  By signing my name below, I, Terri Rios, attest that this documentation has been prepared under the direction and in the presence of Terri Smyers, MD. Electronically Signed: Thelma Rios, Scribe. 08/04/16. 11:52 PM.  [facility] does not have panorex xray confirmed by radiology and CT techs  History   Chief Complaint Chief Complaint  Patient presents with  . Dental Pain   The history is provided by the patient. No language interpreter was used.  Dental Pain   This is a recurrent problem. The current episode started 2 days ago. The problem occurs constantly. The problem has been gradually worsening. The pain is severe. She has tried nothing (did not fill the medication that she was given at discharge by Rhea Bleacher) for the symptoms. The treatment provided no relief.   HPI Comments: Terri Rios is a 41 y.o. female who presents to the Emergency Department complaining of continued dental pain.  Was seen by Rhea Bleacher 5 hours ago and returns and she has not filled the prescriptions for the antibiotics nor pain medication and her family member is demanding admission as she was admitted for same in January of 2017 and had extractions done.  Family member is irate and stating that he is sure the patient has an abscess and he told the previous provider this earlier.  He states he is not bring the patient back 3 or 4 times as he did last year.  Family is stating they were not happy that they were discharged earlier and the patient is now having worse pain.  No F/C/R.  No change in voice, is tolerating oral liquids, has intact phonation.    Past Medical History:  Diagnosis Date  . Anxiety   . Asthma   . Carpal tunnel syndrome of right wrist   . Headache    migraines  . Hypertension   . Kidney stones   . Muscle spasms of neck   . Pneumonia     Patient Active Problem List   Diagnosis Date Noted  .  Dental caries 04/04/2015  . Neck pain 03/30/2015    Past Surgical History:  Procedure Laterality Date  . HEMORRHOID SURGERY    . KNEE SURGERY Right   . MULTIPLE EXTRACTIONS WITH ALVEOLOPLASTY Bilateral 04/04/2015   Procedure: EXTRACTION OF TOOTH #'S 19,29, 30 WITH ALVEOLOPLASTY;  Surgeon: Charlynne Pander, DDS;  Location: [facility] OR;  Service: Oral Surgery;  Laterality: Bilateral;    OB History    No data available       Home Medications    Prior to Admission medications   Medication Sig Start Date End Date Taking? Authorizing Provider  albuterol (PROVENTIL HFA;VENTOLIN HFA) 108 (90 Base) MCG/ACT inhaler Inhale 1-2 puffs into the lungs every 6 (six) hours as needed for wheezing or shortness of breath. 04/26/16   Law, Waylan Boga, PA-C  clindamycin (CLEOCIN) 150 MG capsule Take 2 capsules (300 mg total) by mouth every 6 (six) hours. 08/04/16   Renne Crigler, PA-C  DICLOFENAC PO Take by mouth.    [provider]  ibuprofen (ADVIL,MOTRIN) 800 MG tablet Take 1 tablet (800 mg total) by mouth 3 (three) times daily. 04/26/16   Law, Waylan Boga, PA-C  methocarbamol (ROBAXIN) 500 MG tablet Take 1 tablet (500 mg total) by mouth 2 (two) times daily. 04/26/16   Law, Waylan Boga, PA-C  naproxen (NAPROSYN) 500 MG tablet Take 1 tablet (500 mg  total) by mouth 2 (two) times daily. 08/04/16   Renne Crigler, PA-C  oxyCODONE-acetaminophen (PERCOCET/ROXICET) 5-325 MG tablet Take 1-2 tablets by mouth every 6 (six) hours as needed for severe pain. 08/04/16   Renne Crigler, PA-C    Family History Family History  Problem Relation Age of Onset  . Diabetes Mellitus II Mother   . Hypertension Mother   . Hypertension Father   . Diabetes Mellitus II Father     Social History Social History  Substance Use Topics  . Smoking status: Current Every Day Smoker    Packs/day: 1.00  . Smokeless tobacco: Never Used  . Alcohol use No     Allergies   Morphine and related and Iodine   Review of  Systems Review of Systems  Constitutional: Negative for fever.  HENT: Positive for dental problem. Negative for drooling, facial swelling, trouble swallowing and voice change.        Dental pain  Respiratory: Negative for shortness of breath.   Musculoskeletal: Positive for neck pain.  Neurological: Negative for weakness and numbness.  All other systems reviewed and are negative.    Physical Exam Updated Vital Signs BP 123/88 (BP Location: Left Arm)   Pulse 75   Temp 98.9 F (37.2 C) (Oral)   Resp (!) 21   Ht 5\' 2"  (1.575 m)   Wt 180 lb (81.6 kg)   LMP 07/21/2016   SpO2 100%   BMI 32.92 kg/m   Physical Exam  Constitutional: She is oriented to person, place, and time. She appears well-developed and well-nourished. No distress.  HENT:  Head: Normocephalic and atraumatic.  Mouth/Throat: No trismus in the jaw. Dental caries present. No uvula swelling.    No swelling to her lip tongue or uvula  Eyes: Conjunctivae are normal. Pupils are equal, round, and reactive to light.  Neck: Normal range of motion and phonation normal. Neck supple. No spinous process tenderness present. No neck rigidity. No tracheal deviation and normal range of motion present. No thyromegaly present.  Cardiovascular: Normal rate, regular rhythm, normal heart sounds and intact distal pulses.   Pulmonary/Chest: Effort normal and breath sounds normal. No stridor. She has no wheezes. She has no rales.  Abdominal: Soft. Bowel sounds are normal. There is no tenderness.  Musculoskeletal: Normal range of motion. She exhibits no deformity.  Lymphadenopathy:       Head (right side): No preauricular adenopathy present.       Head (left side): No preauricular adenopathy present.    She has no cervical adenopathy.       Right cervical: No superficial cervical, no deep cervical and no posterior cervical adenopathy present.      Left cervical: No superficial cervical, no deep cervical and no posterior cervical  adenopathy present.  Neurological: She is alert and oriented to person, place, and time. She displays normal reflexes.  Skin: Skin is warm and dry. Capillary refill takes less than 2 seconds.  Psychiatric: She has a normal mood and affect.  Nursing note and vitals reviewed.    ED Treatments / Results   Vitals:   08/04/16 2322  BP: 123/88  Pulse: 75  Resp: (!) 21  Temp: 98.9 F (37.2 C)    DIAGNOSTIC STUDIES: Oxygen Saturation is 100% on RA, normal by my interpretation.    COORDINATION OF CARE: 11:55 PM Discussed treatment plan with pt at bedside and pt agreed to plan. Labs (all labs ordered are listed, but only abnormal results are displayed)  Radiology  Results  for orders placed or performed during the hospital encounter of 08/04/16  Pregnancy, urine  Result Value Ref Range   Preg Test, Ur NEGATIVE NEGATIVE   Ct Soft Tissue Neck Wo Contrast  Result Date: 08/05/2016 CLINICAL DATA:  41 y/o F; pain with question of top from tooth abscess. EXAM: CT NECK WITHOUT CONTRAST TECHNIQUE: Multidetector CT imaging of the neck was performed following the standard protocol without intravenous contrast. COMPARISON:  03/29/2015 CT of the neck. FINDINGS: Pharynx and larynx: Normal. No mass or swelling. Salivary glands: No inflammation, mass, or stone. Thyroid: Normal. Lymph nodes: None enlarged or abnormal density. Vascular: Negative. Limited intracranial: Negative. Visualized orbits: Negative. Mastoids and visualized paranasal sinuses: Clear. Skeleton: Left maxillary first incisor large dental carie and small periapical cyst compatible with odontogenic disease. No associated soft tissue abscess identified. Multiple additional small dental caries are present. Upper chest: Negative. Other: None. IMPRESSION: Left maxillary first incisor large dental carie and small periapical cyst compatible with odontogenic disease. No associated soft tissue abscess identified. Electronically Signed   By: Mitzi Hansen M.D.   On: 08/05/2016 01:04    Procedures Procedures (including critical care time)  Medications Ordered in ED Medications  ketorolac (TORADOL) injection 30 mg (30 mg Intramuscular Given 08/04/16 2353)  methocarbamol (ROBAXIN) tablet 1,000 mg (1,000 mg Oral Given 08/04/16 2353)  clindamycin (CLEOCIN) capsule 300 mg (300 mg Oral Given 08/05/16 0001)  oxyCODONE-acetaminophen (PERCOCET/ROXICET) 5-325 MG per tablet 1 tablet (1 tablet Oral Given 08/05/16 0124)    06/12/2016 06/12/2016 OXYCODONE- ACETAMINOPHEN 5- 325 [id number] 20 4 0 0 [id number] [place], [place] [id number] [facility] [address] [place], [place] Gorter, Naveena 10/14/1975 [address] [place], [place] [address] 01 37.5 06/08/2016 06/08/2016 DIAZEPAM 5 MG TABLET [id number] 15 5 0 0 [id number] GLENN III DAVID L (MD) [place], [place] [id number] [facility] [address] [place], [place] Sem, Shiryl Nov 11, 1975 [address] [place], [place] [address] 01 UNK 06/06/2016 06/06/2016 HYDROCODONE- ACETAMIN 5- 325 MG [id number] 20 2 0 0 [id number] GLENN III DAVID L (MD) [place], [place] [id number] [facility] [address] [place], [place] Shiflet, Emaly 13-May-1975 [address] [place], [place] [address] 01 50 03/30/2016 03/15/2016 TRAMADOL HCL 50 MG TABLET [id number] 30 10 0 0 [address] [place], [place] [id number] [facility] [address] [place], [place] ZURISADAI, HELMINIAK 10-Dec-1975 [address] Karleen Hampshire [place], [place] [address] 04 15 11/11/2015 11/03/2015 HYDROCODONE- ACETAMIN 5- 325 MG [id number] 20 3 0 0 [id number] Greggory Keen [place], [place] [id number] [facility] [address] [place], [place] Lemley, Gayanne 12-26-1975 [address] [place], [place] [address] 04 33.33  MDM:    The family member who is present is demanding admission and attempting to dictate care.  He says with patient's nurse and scribe present that he told the previous provider that she would  have to come back for admission.  With nurse and scribe present he is stating he did not get what he wanted on last visit and telling the ED physician that she is wrong and demanding admission for exaction etc. He is demanding CT scan as he states it will show an abscess in the back of the throat, just like last time.  He stated he would keep brining the patient back until they got admission.  They have not filled the medications that were prescribed by Rhea Bleacher who is still in the department at the time of evaluation by the EDP.   The patient is definitively does NOT require admission as there is no  swelling and there are no signs of systemic illness.  EDP apologized multiple times to the patient and the irate family member for their frustration but stated politely that the care he is demanding is not appropriate as the patient does not meet inpatient criteria.  There are no signs of sepsis on vitals nor exam.   EDP did perform the CT as requested by the family member and this was negative for signs of abscess or soft tissue infection, there is no lymphadenopathy.  EDP apologized that the patient has had pain and the EDP sat and explained results of CT scan at length to both the patient and family member with charge nurse, Chanin, present.  EDP explained multiple times politely that there are no abscesses seen on CT scan and with exam showing only one isolated cavity of tooth #9 and no signs of sepsis or any other emergency or life threatening condition there are  admission criteria at this time.  The family member repeatedly asked about neck abscesses and EDP politely explained that there were no soft tissue infections no abscesses no lymph gland swelling and all bones are healthy and normal on CT scan.  Patient stated she was still in pain and EDP offered one dose of percocet and stated that what was going to make the patient better was filling and taking all antibiotics as directed and and filling her  medications which she had no yet filled and also following up on Monday with a dentist for ongoing care of the necrotic tooth before there is systemic infection.      Final Clinical Impressions(s) / ED Diagnoses   Cavity of the tooth ellis fracture:  No abscesses or systemic infection.  Return to the closest ED for fevers > 101, head or neck swelling inability to open the mouth, shortness of breath, drainage from the gums, elevation of the floor or the mouth, swelling of the lips or tongue, hives or any concerns.  Take all your clindamycin as directed, and naproxen and the percocet rx given to you earlier by Rhea BleacherJosh Geiple.    I personally performed the services described in this documentation, which was scribed in my presence. The recorded information has been reviewed and is accurate.    ________________________________________________   Cy BlamerPalumbo, Angeli Demilio, MD 08/05/16 Bailey [id number]    Avrie Kedzierski, MD 08/05/16 0200

## 2016-08-04 NOTE — ED Notes (Addendum)
D/c'd at 1750. Received percocet and clindamycin at 1715. Given scripts for the same. Pt answering some questions. Family speaking for pt for many questions d/t pain. Family reports h/o of similar episode of L sided dental pain, with radiating pain to L face, ear, neck and arm, with limited ROM of neck and arm leading to 1 year ago with admission and dental surgery.

## 2016-08-04 NOTE — ED Triage Notes (Signed)
Pt c/o dental pain x 2 days.  

## 2016-08-04 NOTE — ED Notes (Signed)
Pt teaching provided on medications that may cause drowsiness. Pt instructed not to drive or operate heavy machinery while taking the prescribed medication. Pt verbalized understanding.   

## 2016-08-04 NOTE — ED Provider Notes (Signed)
[facility] Provider Note   CSN: [id number] Arrival date & time: 08/04/16  1400     History   Chief Complaint Chief Complaint  Patient presents with  . Dental Pain    HPI Terri Rios is a 41 y.o. female.  Patient presents with complaint of acute onset left-sided dental pain yesterday. Patient has a history of dental abscess requiring hospitalization for IV antibiotics. She reports subjective facial swelling. Pain radiates down into her left shoulder. No vision change or weakness in extremities. Patient notes that her first left incisor broke approximately one month ago. She is concerned that her symptoms are going to worsen and she will need hospitalization again. The onset of this condition was acute. The course is constant. Aggravating factors: none. Alleviating factors: none.        Past Medical History:  Diagnosis Date  . Anxiety   . Asthma   . Carpal tunnel syndrome of right wrist   . Headache    migraines  . Kidney stones   . Muscle spasms of neck   . Pneumonia     Patient Active Problem List   Diagnosis Date Noted  . Dental caries 04/04/2015  . Neck pain 03/30/2015    Past Surgical History:  Procedure Laterality Date  . HEMORRHOID SURGERY    . KNEE SURGERY Right   . MULTIPLE EXTRACTIONS WITH ALVEOLOPLASTY Bilateral 04/04/2015   Procedure: EXTRACTION OF TOOTH #'S 19,29, 30 WITH ALVEOLOPLASTY;  Surgeon: Charlynne Pander, DDS;  Location: [facility] OR;  Service: Oral Surgery;  Laterality: Bilateral;    OB History    No data available       Home Medications    Prior to Admission medications   Medication Sig Start Date End Date Taking? Authorizing Provider  albuterol (PROVENTIL HFA;VENTOLIN HFA) 108 (90 Base) MCG/ACT inhaler Inhale 1-2 puffs into the lungs every 6 (six) hours as needed for wheezing or shortness of breath. 04/26/16   Law, Waylan Boga, PA-C  clindamycin (CLEOCIN) 150 MG capsule Take 2 capsules (300 mg total) by mouth every 6 (six)  hours. 08/04/16   Renne Crigler, PA-C  DICLOFENAC PO Take by mouth.    [provider]  ibuprofen (ADVIL,MOTRIN) 800 MG tablet Take 1 tablet (800 mg total) by mouth 3 (three) times daily. 04/26/16   Law, Waylan Boga, PA-C  methocarbamol (ROBAXIN) 500 MG tablet Take 1 tablet (500 mg total) by mouth 2 (two) times daily. 04/26/16   Law, Waylan Boga, PA-C  naproxen (NAPROSYN) 500 MG tablet Take 1 tablet (500 mg total) by mouth 2 (two) times daily. 08/04/16   Renne Crigler, PA-C  oxyCODONE-acetaminophen (PERCOCET/ROXICET) 5-325 MG tablet Take 1-2 tablets by mouth every 6 (six) hours as needed for severe pain. 08/04/16   Renne Crigler, PA-C    Family History Family History  Problem Relation Age of Onset  . Diabetes Mellitus II Mother   . Hypertension Mother   . Hypertension Father   . Diabetes Mellitus II Father     Social History Social History  Substance Use Topics  . Smoking status: Current Every Day Smoker    Packs/day: 1.00  . Smokeless tobacco: Never Used  . Alcohol use No     Allergies   Morphine and related and Iodine   Review of Systems Review of Systems  Constitutional: Negative for fever.  HENT: Positive for dental problem and facial swelling. Negative for ear pain, sore throat and trouble swallowing.   Respiratory: Negative for shortness of breath and  stridor.   Musculoskeletal: Negative for neck pain.  Skin: Negative for color change.  Neurological: Negative for headaches.     Physical Exam Updated Vital Signs BP 131/90   Pulse 84   Temp 98.8 F (37.1 C)   Resp 18   Ht 5\' 2"  (1.575 m)   Wt 81.6 kg   LMP 07/21/2016   SpO2 100%   BMI 32.92 kg/m   Physical Exam  Constitutional: She appears well-developed and well-nourished.  HENT:  Head: Normocephalic and atraumatic.  Right Ear: Tympanic membrane, external ear and ear canal normal.  Left Ear: Tympanic membrane, external ear and ear canal normal.  Nose: Nose normal.  Mouth/Throat: Uvula is  midline, oropharynx is clear and moist and mucous membranes are normal. No trismus in the jaw. Abnormal dentition. Dental caries present. No dental abscesses or uvula swelling. No tonsillar abscesses.  Patient with broken tooth #9. There is generalized tenderness in this area without palpable or visible abscess. No gross facial swelling. No trismus. Full range of motion of neck. There is some tenderness down the left neck into the left shoulder.  Eyes: Conjunctivae are normal.  Neck: Normal range of motion. Neck supple.  No neck swelling or Ludwig's angina  Cardiovascular:  Pulses:      Radial pulses are 2+ on the right side, and 2+ on the left side.  Musculoskeletal:  No upper extremity weakness.  Lymphadenopathy:    She has no cervical adenopathy.  Neurological: She is alert.  Skin: Skin is warm and dry.  Psychiatric: She has a normal mood and affect.  Nursing note and vitals reviewed.    ED Treatments / Results   Procedures Procedures (including critical care time)  Medications Ordered in ED Medications  oxyCODONE-acetaminophen (PERCOCET/ROXICET) 5-325 MG per tablet 1 tablet (1 tablet Oral Given 08/04/16 1717)  clindamycin (CLEOCIN) capsule 300 mg (300 mg Oral Given 08/04/16 1717)     Initial Impression / Assessment and Plan / ED Course  I have reviewed the triage vital signs and the nursing notes.  Pertinent labs & imaging results that were available during my care of the patient were reviewed by me and considered in my medical decision making (see chart for details).     5:37 PM Patient seen and examined. I reviewed previous imaging and admission. Patient was admitted given concern for right upper extremity weakness at the time of her previous infection. She had a 1 cm abscess over the right mandibular angle. She did have dental extraction.   Vital signs reviewed and are as follows: BP 131/90   Pulse 84   Temp 98.8 F (37.1 C)   Resp 18   Ht 5\' 2"  (1.575 m)   Wt 81.6  kg   LMP 07/21/2016   SpO2 100%   BMI 32.92 kg/m    Patient is appropriately concerned given her previous history. I discussed that do not see any concerning symptoms which would warrant CT today. This includes pain with motion of the neck and trismus. I do not see any symptoms or exam findings that would suggest vascular injury in the neck. Will start on antibiotics and control pain. Patient invited to return tomorrow for recheck if symptoms are not improved. Patient and husband are comfortable with that plan.  Patient counseled on use of narcotic pain medications. Counseled not to combine these medications with others containing tylenol. Urged not to drink alcohol, drive, or perform any other activities that requires focus while taking these medications. The patient  verbalizes understanding and agrees with the plan.  Patient counseled to take prescribed medications as directed, return with worsening facial or neck swelling, and to follow-up with their dentist as soon as possible.    Final Clinical Impressions(s) / ED Diagnoses   Final diagnoses:  Pain, dental   Patient with toothache. No fever. Exam unconcerning for Ludwig's angina or other deep tissue infection in neck.   As there is gum tenderness and subjective facial swelling, will treat with antibiotic and pain medicine. Urged patient to follow-up with dentist.    New Prescriptions New Prescriptions   CLINDAMYCIN (CLEOCIN) 150 MG CAPSULE    Take 2 capsules (300 mg total) by mouth every 6 (six) hours.   NAPROXEN (NAPROSYN) 500 MG TABLET    Take 1 tablet (500 mg total) by mouth 2 (two) times daily.   OXYCODONE-ACETAMINOPHEN (PERCOCET/ROXICET) 5-325 MG TABLET    Take 1-2 tablets by mouth every 6 (six) hours as needed for severe pain.     Renne Crigler, PA-C 08/04/16 1739    Rolland Porter, MD 08/10/16 [phone]

## 2016-08-04 NOTE — Discharge Instructions (Signed)
Please read and follow all provided instructions.  Your diagnoses today include:  1. Pain, dental     The exam and treatment you received today has been provided on an emergency basis only. This is not a substitute for complete medical or dental care.  Tests performed today include:  Vital signs. See below for your results today.   Medications prescribed:   Clindamycin - antibiotic  You have been prescribed an antibiotic medicine: take the entire course of medicine even if you are feeling better. Stopping early can cause the antibiotic not to work.   Naproxen - anti-inflammatory pain medication  Do not exceed 500mg  naproxen every 12 hours, take with food  You have been prescribed an anti-inflammatory medication or NSAID. Take with food. Take smallest effective dose for the shortest duration needed for your pain. Stop taking if you experience stomach pain or vomiting.    Percocet (oxycodone/acetaminophen) - narcotic pain medication  DO NOT drive or perform any activities that require you to be awake and alert because this medicine can make you drowsy. BE VERY CAREFUL not to take multiple medicines containing Tylenol (also called acetaminophen). Doing so can lead to an overdose which can damage your liver and cause liver failure and possibly death.  Take any prescribed medications only as directed.  Home care instructions:  Follow any educational materials contained in this packet.  Follow-up instructions: Please follow-up with your dentist for further evaluation of your symptoms.   Return instructions:   Please return to the Emergency Department if you experience worsening symptoms.  Please return if you develop a fever, you develop more swelling in your face or neck, you have trouble breathing or swallowing food.  Please return if you have any other emergent concerns.  Additional Information:  Your vital signs today were: BP 131/90    Pulse 84    Temp 98.8 F (37.1  C)    Resp 18    Ht 5\' 2"  (1.575 m)    Wt 81.6 kg    LMP 07/21/2016    SpO2 100%    BMI 32.92 kg/m  If your blood pressure (BP) was elevated above 135/85 this visit, please have this repeated by your doctor within one month. --------------

## 2016-08-04 NOTE — ED Notes (Signed)
[facility]

## 2016-08-05 ENCOUNTER — Encounter (HOSPITAL_BASED_OUTPATIENT_CLINIC_OR_DEPARTMENT_OTHER): Payer: Self-pay | Admitting: Emergency Medicine

## 2016-08-05 ENCOUNTER — Emergency Department (HOSPITAL_BASED_OUTPATIENT_CLINIC_OR_DEPARTMENT_OTHER): Payer: Self-pay

## 2016-08-05 LAB — PREGNANCY, URINE: Preg Test, Ur: NEGATIVE

## 2016-08-05 MED ORDER — NAPROXEN 375 MG PO TABS: 375.0000 mg | ORAL_TABLET | Freq: Two times a day (BID) | ORAL | 0 refills | Status: DC

## 2016-08-05 MED ORDER — OXYCODONE-ACETAMINOPHEN 5-325 MG PO TABS
1.0000 | ORAL_TABLET | Freq: Once | ORAL | Status: AC
  Administered 2016-08-05: 1 via ORAL
  Filled 2016-08-05: qty 1

## 2016-08-05 NOTE — ED Notes (Signed)
D/C instructions given at length to pt and her husband. They both voiced understanding. Ice pack given and a snack with meds.

## 2016-08-05 NOTE — ED Notes (Signed)
Family has left BS. Pt resting more comfortably. Reports decreased pain, injection starting to work.

## 2016-08-05 NOTE — ED Notes (Signed)
Pt to CT

## 2016-08-05 NOTE — ED Notes (Addendum)
EDP into room with RN, meds, diagnostics, admission criteria and plan explained with rationale. Family frustrated. Questions answered.

## 2016-09-13 ENCOUNTER — Encounter (HOSPITAL_BASED_OUTPATIENT_CLINIC_OR_DEPARTMENT_OTHER): Payer: Self-pay

## 2016-09-13 ENCOUNTER — Emergency Department (HOSPITAL_BASED_OUTPATIENT_CLINIC_OR_DEPARTMENT_OTHER)
Admission: EM | Admit: 2016-09-13 | Discharge: 2016-09-13 | Disposition: A | Discharge status: Home / Self Care | Payer: Self-pay | Attending: Emergency Medicine | Admitting: Emergency Medicine

## 2016-09-13 DIAGNOSIS — R112 Nausea with vomiting, unspecified: Secondary | ICD-10-CM | POA: Insufficient documentation

## 2016-09-13 DIAGNOSIS — R197 Diarrhea, unspecified: Secondary | ICD-10-CM

## 2016-09-13 DIAGNOSIS — E860 Dehydration: Secondary | ICD-10-CM | POA: Insufficient documentation

## 2016-09-13 DIAGNOSIS — Z79899 Other long term (current) drug therapy: Secondary | ICD-10-CM | POA: Insufficient documentation

## 2016-09-13 DIAGNOSIS — F17210 Nicotine dependence, cigarettes, uncomplicated: Secondary | ICD-10-CM | POA: Insufficient documentation

## 2016-09-13 DIAGNOSIS — I10 Essential (primary) hypertension: Secondary | ICD-10-CM | POA: Insufficient documentation

## 2016-09-13 DIAGNOSIS — J45909 Unspecified asthma, uncomplicated: Secondary | ICD-10-CM | POA: Insufficient documentation

## 2016-09-13 DIAGNOSIS — R42 Dizziness and giddiness: Secondary | ICD-10-CM

## 2016-09-13 HISTORY — DX: Dorsalgia, unspecified: M54.9

## 2016-09-13 LAB — COMPREHENSIVE METABOLIC PANEL
ALK PHOS: 69 U/L (ref 38–126)
ALT: 28 U/L (ref 14–54)
AST: 22 U/L (ref 15–41)
Albumin: 4.1 g/dL (ref 3.5–5.0)
Anion gap: 10 (ref 5–15)
BUN: 9 mg/dL (ref 6–20)
CALCIUM: 9 mg/dL (ref 8.9–10.3)
CO2: 23 mmol/L (ref 22–32)
CREATININE: 0.76 mg/dL (ref 0.44–1.00)
Chloride: 105 mmol/L (ref 101–111)
Glucose, Bld: 102 mg/dL — ABNORMAL HIGH (ref 65–99)
Potassium: 3.9 mmol/L (ref 3.5–5.1)
Sodium: 138 mmol/L (ref 135–145)
Total Bilirubin: 0.4 mg/dL (ref 0.3–1.2)
Total Protein: 7.6 g/dL (ref 6.5–8.1)

## 2016-09-13 LAB — CBC WITH DIFFERENTIAL/PLATELET
Basophils Absolute: 0 10*3/uL (ref 0.0–0.1)
Basophils Relative: 1 %
Eosinophils Absolute: 0.2 10*3/uL (ref 0.0–0.7)
Eosinophils Relative: 3 %
HCT: 38.4 % (ref 36.0–46.0)
HEMOGLOBIN: 13 g/dL (ref 12.0–15.0)
LYMPHS ABS: 2.3 10*3/uL (ref 0.7–4.0)
Lymphocytes Relative: 33 %
MCH: 30.6 pg (ref 26.0–34.0)
MCHC: 33.9 g/dL (ref 30.0–36.0)
MCV: 90.4 fL (ref 78.0–100.0)
Monocytes Absolute: 0.5 10*3/uL (ref 0.1–1.0)
Monocytes Relative: 7 %
NEUTROS ABS: 3.9 10*3/uL (ref 1.7–7.7)
Neutrophils Relative %: 56 %
Platelets: 293 10*3/uL (ref 150–400)
RBC: 4.25 MIL/uL (ref 3.87–5.11)
RDW: 13.6 % (ref 11.5–15.5)
WBC: 6.9 10*3/uL (ref 4.0–10.5)

## 2016-09-13 LAB — URINALYSIS, ROUTINE W REFLEX MICROSCOPIC
Bilirubin Urine: NEGATIVE
GLUCOSE, UA: NEGATIVE mg/dL
Ketones, ur: NEGATIVE mg/dL
LEUKOCYTES UA: NEGATIVE
Nitrite: NEGATIVE
PH: 5.5 (ref 5.0–8.0)
Protein, ur: NEGATIVE mg/dL
SPECIFIC GRAVITY, URINE: 1.023 (ref 1.005–1.030)

## 2016-09-13 LAB — CBG MONITORING, ED: Glucose-Capillary: 106 mg/dL — ABNORMAL HIGH (ref 65–99)

## 2016-09-13 LAB — URINALYSIS, MICROSCOPIC (REFLEX)

## 2016-09-13 LAB — PREGNANCY, URINE: Preg Test, Ur: NEGATIVE

## 2016-09-13 LAB — LIPASE, BLOOD: Lipase: 36 U/L (ref 11–51)

## 2016-09-13 MED ORDER — ONDANSETRON 4 MG PO TBDP
4.0000 mg | ORAL_TABLET | Freq: Once | ORAL | Status: AC
  Administered 2016-09-13: 4 mg via ORAL
  Filled 2016-09-13: qty 1

## 2016-09-13 MED ORDER — SODIUM CHLORIDE 0.9 % IV BOLUS (SEPSIS)
2000.0000 mL | Freq: Once | INTRAVENOUS | Status: AC
  Administered 2016-09-13: 2000 mL via INTRAVENOUS

## 2016-09-13 MED ORDER — PROMETHAZINE HCL 25 MG PO TABS: 25.0000 mg | ORAL_TABLET | Freq: Four times a day (QID) | ORAL | 0 refills | Status: DC | PRN

## 2016-09-13 NOTE — ED Notes (Signed)
ED Provider at bedside.  Sprite given for PO challenge.

## 2016-09-13 NOTE — ED Provider Notes (Signed)
[facility] Provider Note   CSN: [id number] Arrival date & time: 09/13/16  1401     History   Chief Complaint Chief Complaint  Patient presents with  . Dizziness    HPI   Blood pressure (!) 135/96, pulse 88, temperature 98.3 F (36.8 C), temperature source Oral, resp. rate 18, height 5\' 2"  (1.575 m), weight 99.8 kg (220 lb), last menstrual period 08/27/2016, SpO2 100 %.  Terri Rios is a 41 y.o. female complaining of Nonbloody, nonbilious, no coffee-ground emesis with associated diarrhea onset 4 days ago with associated lightheadedness and intermittent abdominal pain really only when she's having a bowel movement. She denies any sick contacts but she has a new job working at [employer]. She denies any fever, chills, chest pain, shortness of breath.   Past Medical History:  Diagnosis Date  . Anxiety   . Asthma   . Back pain   . Carpal tunnel syndrome of right wrist   . Headache    migraines  . Hypertension   . Kidney stones   . Muscle spasms of neck   . Pneumonia     Patient Active Problem List   Diagnosis Date Noted  . Dental caries 04/04/2015  . Neck pain 03/30/2015    Past Surgical History:  Procedure Laterality Date  . HEMORRHOID SURGERY    . KNEE SURGERY Right   . MULTIPLE EXTRACTIONS WITH ALVEOLOPLASTY Bilateral 04/04/2015   Procedure: EXTRACTION OF TOOTH #'S 19,29, 30 WITH ALVEOLOPLASTY;  Surgeon: Charlynne Pander, DDS;  Location: [facility] OR;  Service: Oral Surgery;  Laterality: Bilateral;    OB History    No data available       Home Medications    Prior to Admission medications   Medication Sig Start Date End Date Taking? Authorizing Provider  albuterol (PROVENTIL HFA;VENTOLIN HFA) 108 (90 Base) MCG/ACT inhaler Inhale 1-2 puffs into the lungs every 6 (six) hours as needed for wheezing or shortness of breath. 04/26/16   Law, Waylan Boga, PA-C  methocarbamol (ROBAXIN) 500 MG tablet Take 1 tablet (500 mg total) by mouth 2 (two) times daily. 04/26/16    Law, Waylan Boga, PA-C  naproxen (NAPROSYN) 375 MG tablet Take 1 tablet (375 mg total) by mouth 2 (two) times daily. 08/05/16   Palumbo, April, MD  promethazine (PHENERGAN) 25 MG tablet Take 1 tablet (25 mg total) by mouth every 6 (six) hours as needed for nausea or vomiting. 09/13/16   Tomiko Schoon, Joni Reining, PA-C    Family History Family History  Problem Relation Age of Onset  . Diabetes Mellitus II Mother   . Hypertension Mother   . Hypertension Father   . Diabetes Mellitus II Father     Social History Social History  Substance Use Topics  . Smoking status: Current Every Day Smoker    Packs/day: 1.00  . Smokeless tobacco: Never Used  . Alcohol use No     Allergies   Morphine and related and Iodine   Review of Systems Review of Systems  A complete review of systems was obtained and all systems are negative except as noted in the HPI and PMH.   Physical Exam Updated Vital Signs BP 101/67   Pulse 70   Temp 98.3 F (36.8 C) (Oral)   Resp 18   Ht 5\' 2"  (1.575 m)   Wt 99.8 kg (220 lb)   LMP 08/27/2016   SpO2 99%   BMI 40.24 kg/m   Physical Exam  Constitutional: She is oriented to  person, place, and time. She appears well-developed and well-nourished. No distress.  HENT:  Head: Normocephalic and atraumatic.  Mouth/Throat: Oropharynx is clear and moist.  MMM  Eyes: Conjunctivae and EOM are normal. Pupils are equal, round, and reactive to light.  Neck: Normal range of motion.  Cardiovascular: Normal rate, regular rhythm and intact distal pulses.   Pulmonary/Chest: Effort normal and breath sounds normal.  Abdominal: Soft. There is no tenderness.  Musculoskeletal: Normal range of motion.  Neurological: She is alert and oriented to person, place, and time.  Skin: She is not diaphoretic.  Psychiatric: She has a normal mood and affect.  Nursing note and vitals reviewed.    ED Treatments / Results  Labs (all labs ordered are listed, but only abnormal results are  displayed) Labs Reviewed  URINALYSIS, ROUTINE W REFLEX MICROSCOPIC - Abnormal; Notable for the following:       Result Value   APPearance CLOUDY (*)    Hgb urine dipstick MODERATE (*)    All other components within normal limits  URINALYSIS, MICROSCOPIC (REFLEX) - Abnormal; Notable for the following:    Bacteria, UA FEW (*)    Squamous Epithelial / LPF 6-30 (*)    All other components within normal limits  COMPREHENSIVE METABOLIC PANEL - Abnormal; Notable for the following:    Glucose, Bld 102 (*)    All other components within normal limits  CBG MONITORING, ED - Abnormal; Notable for the following:    Glucose-Capillary 106 (*)    All other components within normal limits  PREGNANCY, URINE  CBC WITH DIFFERENTIAL/PLATELET  LIPASE, BLOOD    EKG  EKG Interpretation  Date/Time:  Thursday September 13 2016 14:25:59 EDT Ventricular Rate:  86 PR Interval:    QRS Duration: 77 QT Interval:  344 QTC Calculation: 412 R Axis:   52 Text Interpretation:  Sinus rhythm Low voltage, precordial leads ST elev, probable normal early repol pattern Confirmed by Geoffery Lyons ([id number]) on 09/13/2016 3:20:10 PM       Radiology No results found.  Procedures Procedures (including critical care time)  Medications Ordered in ED Medications  ondansetron (ZOFRAN-ODT) disintegrating tablet 4 mg (4 mg Oral Given 09/13/16 1458)  sodium chloride 0.9 % bolus 2,000 mL (2,000 mLs Intravenous New Bag/Given 09/13/16 1537)     Initial Impression / Assessment and Plan / ED Course  I have reviewed the triage vital signs and the nursing notes.  Pertinent labs & imaging results that were available during my care of the patient were reviewed by me and considered in my medical decision making (see chart for details).     Vitals:   09/13/16 1405 09/13/16 1406 09/13/16 1445 09/13/16 1617  BP: (!) 135/96  123/71 101/67  Pulse: 88  86 70  Resp: 18  18 18   Temp: 98.3 F (36.8 C)     TempSrc: Oral     SpO2: 100%   98% 99%  Weight:  99.8 kg (220 lb)    Height:  5\' 2"  (1.575 m)      Medications  ondansetron (ZOFRAN-ODT) disintegrating tablet 4 mg (4 mg Oral Given 09/13/16 1458)  sodium chloride 0.9 % bolus 2,000 mL (2,000 mLs Intravenous New Bag/Given 09/13/16 1537)    Terri Rios is 41 y.o. female presenting with  Nausea vomiting diarrhea and lightheaded sensation. This been going on for approximately 4 days. Abdominal exam is benign. Vital signs reassuring. Patient reports some darker than normal stool. I would like to test her for occult blood  however patient declines. She has capacity for medical decision making and understands the risks and benefits. Nurse observed this conversation.  Mild hematuria likely from dehydration the orthostatic vital signs were not convincing however patient feels much improved after hydration. Will give her another liter of fluid, by mouth challenge and ambulate her before discharge.  Blood work reassuring and repeat abdominal exam is benign.  Evaluation does not show pathology that would require ongoing emergent intervention or inpatient treatment. Pt is hemodynamically stable and mentating appropriately. Discussed findings and plan with patient/guardian, who agrees with care plan. All questions answered. Return precautions discussed and outpatient follow up given.    Final Clinical Impressions(s) / ED Diagnoses   Final diagnoses:  Nausea vomiting and diarrhea  Dehydration  Dizziness    New Prescriptions New Prescriptions   PROMETHAZINE (PHENERGAN) 25 MG TABLET    Take 1 tablet (25 mg total) by mouth every 6 (six) hours as needed for nausea or vomiting.     Kaylyn Limisciotta, Oniel Meleski, PA-C 09/13/16 1644    Geoffery Lyonselo, Douglas, MD 09/14/16 [phone]

## 2016-09-13 NOTE — ED Notes (Signed)
Pt ambulated in the hallway. Pt denies dizziness. Pt had a limping, but steady gate. Pt states the limping is from a chronic pain problems r/t "bulging disc."

## 2016-09-13 NOTE — Discharge Instructions (Signed)
Push fluids: take small frequent sips of water or Gatorade, do not drink any soda, juice or caffeinated beverages.    Slowly resume solid diet as desired. Avoid food that are spicy, contain dairy and/or have high fat content.  Aviod NSAIDs (Relafen, aspirin, motrin, ibuprofen, naproxen, Aleve et Karie Sodacetera) for pain control because they will irritate your stomach.  Please follow with your primary care doctor in the next 2 days for a check-up. They must obtain records for further management.   Do not hesitate to return to the Emergency Department for any new, worsening or concerning symptoms.

## 2016-09-13 NOTE — ED Notes (Signed)
Pt able to tolerate Sprite and water with no n/v.

## 2016-09-13 NOTE — ED Notes (Signed)
This RN and Pisciaotta, PA entered room to collect an occult blood from rectal sample. Pt refusing test at this time. PA advised pt the necessity of the test, and the risks of refusal which included missing a serious diagnosis that could lead to death. Pt verbalized understanding of the risks and continued to refuse the test in the presence of this RN and Pisciaotta, PA.

## 2016-09-13 NOTE — ED Notes (Signed)
ED Provider at bedside. 

## 2016-09-13 NOTE — ED Triage Notes (Signed)
C/o dizziness, light headed and n/v x 4 days-NAD-steady gait

## 2016-10-18 ENCOUNTER — Encounter (HOSPITAL_BASED_OUTPATIENT_CLINIC_OR_DEPARTMENT_OTHER): Payer: Self-pay | Admitting: Emergency Medicine

## 2016-10-18 ENCOUNTER — Emergency Department (HOSPITAL_BASED_OUTPATIENT_CLINIC_OR_DEPARTMENT_OTHER)
Admission: EM | Admit: 2016-10-18 | Discharge: 2016-10-18 | Disposition: A | Discharge status: Home / Self Care | Payer: Self-pay | Attending: Emergency Medicine | Admitting: Emergency Medicine

## 2016-10-18 DIAGNOSIS — R21 Rash and other nonspecific skin eruption: Secondary | ICD-10-CM

## 2016-10-18 DIAGNOSIS — F17200 Nicotine dependence, unspecified, uncomplicated: Secondary | ICD-10-CM | POA: Insufficient documentation

## 2016-10-18 DIAGNOSIS — J45909 Unspecified asthma, uncomplicated: Secondary | ICD-10-CM | POA: Insufficient documentation

## 2016-10-18 DIAGNOSIS — I10 Essential (primary) hypertension: Secondary | ICD-10-CM | POA: Insufficient documentation

## 2016-10-18 MED ORDER — HYDROXYZINE HCL 25 MG PO TABS
ORAL_TABLET | ORAL | Status: AC
  Filled 2016-10-18: qty 1

## 2016-10-18 MED ORDER — HYDROXYZINE HCL 10 MG PO TABS
10.0000 mg | ORAL_TABLET | Freq: Once | ORAL | Status: DC
  Filled 2016-10-18: qty 1

## 2016-10-18 MED ORDER — HYDROCORTISONE 1 % EX CREA: TOPICAL_CREAM | CUTANEOUS | 1 refills | Status: DC

## 2016-10-18 MED ORDER — HYDROXYZINE HCL 25 MG PO TABS
25.0000 mg | ORAL_TABLET | Freq: Once | ORAL | Status: AC
  Administered 2016-10-18: 25 mg via ORAL

## 2016-10-18 MED ORDER — HYDROXYZINE HCL 25 MG PO TABS: 25.0000 mg | ORAL_TABLET | Freq: Four times a day (QID) | ORAL | 0 refills | Status: DC | PRN

## 2016-10-18 NOTE — ED Provider Notes (Signed)
[facility]   CSN: [id number] Arrival date & time: 10/18/16  0753     History   Chief Complaint Chief Complaint  Patient presents with  . Rash    HPI Collene MaresYolanda Done is a 41 y.o. female.  HPI  2 days of worsening rash to mobilize the body. Also all the nurse's small bumps. She states that she lives in her house and has not had any bedbugs. Does not have anyone else with a rash at work or at home. Of note she does work in an extended stay. No fevers, nausea, vomiting or other symptoms. No other associated or modifying factors.  Past Medical History:  Diagnosis Date  . Anxiety   . Asthma   . Back pain   . Carpal tunnel syndrome of right wrist   . Headache    migraines  . Hypertension   . Kidney stones   . Muscle spasms of neck   . Pneumonia     Patient Active Problem List   Diagnosis Date Noted  . Dental caries 04/04/2015  . Neck pain 03/30/2015    Past Surgical History:  Procedure Laterality Date  . HEMORRHOID SURGERY    . KNEE SURGERY Right   . MULTIPLE EXTRACTIONS WITH ALVEOLOPLASTY Bilateral 04/04/2015   Procedure: EXTRACTION OF TOOTH #'S 19,29, 30 WITH ALVEOLOPLASTY;  Surgeon: Charlynne Panderonald F Kulinski, DDS;  [facility];  Service: Oral Surgery;  Laterality: Bilateral;    OB History    No data available       Home Medications    Prior to Admission medications   Medication Sig Start Date End Date Taking? Authorizing Provider  albuterol (PROVENTIL HFA;VENTOLIN HFA) 108 (90 Base) MCG/ACT inhaler Inhale 1-2 puffs into the lungs every 6 (six) hours as needed for wheezing or shortness of breath. 04/26/16   Emi HolesLaw, Alexandra M, PA-C  hydrocortisone cream 1 % Apply to affected area 2 times daily 10/18/16   Shyhiem Beeney, Barbara CowerJason, MD  hydrOXYzine (ATARAX/VISTARIL) 25 MG tablet Take 1 tablet (25 mg total) by mouth every 6 (six) hours as needed for itching. 10/18/16   Esmirna Ravan, Barbara CowerJason, MD  methocarbamol (ROBAXIN) 500 MG tablet Take 1 tablet (500 mg total) by  mouth 2 (two) times daily. 04/26/16   Law, Waylan BogaAlexandra M, PA-C  naproxen (NAPROSYN) 375 MG tablet Take 1 tablet (375 mg total) by mouth 2 (two) times daily. 08/05/16   Palumbo, April, MD  promethazine (PHENERGAN) 25 MG tablet Take 1 tablet (25 mg total) by mouth every 6 (six) hours as needed for nausea or vomiting. 09/13/16   Pisciotta, Joni ReiningNicole, PA-C    Family History Family History  Problem Relation Age of Onset  . Diabetes Mellitus II Mother   . Hypertension Mother   . Hypertension Father   . Diabetes Mellitus II Father     Social History Social History  Substance Use Topics  . Smoking status: Current Every Day Smoker    Packs/day: 1.00  . Smokeless tobacco: Never Used  . Alcohol use No     Allergies   Morphine and related and Iodine   Review of Systems Review of Systems  All other systems reviewed and are negative.    Physical Exam Updated Vital Signs BP 132/79 (BP Location: Right Arm)   Pulse 85   Temp 98.5 F (36.9 C) (Oral)   Resp 18   Ht 5\' 2"  (1.575 m)   Wt 81.6 kg (180 lb)   LMP 10/16/2016 (Exact Date)   SpO2 100%  BMI 32.92 kg/m   Physical Exam  Constitutional: She appears well-developed and well-nourished.  HENT:  Head: Normocephalic and atraumatic.  Neck: Normal range of motion.  Cardiovascular: Normal rate and regular rhythm.   Pulmonary/Chest: No stridor. No respiratory distress.  Abdominal: She exhibits no distension.  Neurological: She is alert.  Skin: Rash (erythematous and excoriated to all extremities and left hip. Associated papular lesion with each one.) noted.  Nursing note and vitals reviewed.    ED Treatments / Results  Labs (all labs ordered are listed, but only abnormal results are displayed) Labs Reviewed - No data to display  EKG  EKG Interpretation None       Radiology No results found.  Procedures Procedures (including critical care time)  Medications Ordered in ED Medications  hydrOXYzine (ATARAX/VISTARIL)  tablet 10 mg (not administered)     Initial Impression / Assessment and Plan / ED Course  I have reviewed the triage vital signs and the nursing notes.  Pertinent labs & imaging results that were available during my care of the patient were reviewed by me and considered in my medical decision making (see chart for details).     Insect bites with itching. Doubt scabies. Possibly bed bugs vs mosquito as she does leave doors open and noticed multiple insects in and around the house.   Final Clinical Impressions(s) / ED Diagnoses   Final diagnoses:  Rash    New Prescriptions New Prescriptions   HYDROCORTISONE CREAM 1 %    Apply to affected area 2 times daily   HYDROXYZINE (ATARAX/VISTARIL) 25 MG TABLET    Take 1 tablet (25 mg total) by mouth every 6 (six) hours as needed for itching.     Cedar Ditullio, Barbara CowerJason, MD 10/18/16 [phone]

## 2016-10-18 NOTE — ED Triage Notes (Signed)
Patient reports generalized rash which began 2 days ago.  Reports pruritis.  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

## 2016-11-24 IMAGING — CT CT NECK W/O CM
2 of 4 series · 11 of 27 positions shown, 14 images · non-contrast
Comparison: None.

CLINICAL DATA: Right-sided head and neck pain. Jaw pain with
trismus.

EXAM:
CT NECK WITHOUT CONTRAST
TECHNIQUE: Multidetector CT imaging of the neck was performed following the
standard protocol without intravenous contrast. Patient has history
of contrast allergy. A cauda

[Series 4: axial neck · axial · 0.50mm/px · z∈[-215,-75]mm · 6 of 100 slices shown, 8 images]
[im 15/100  soft-tissue]
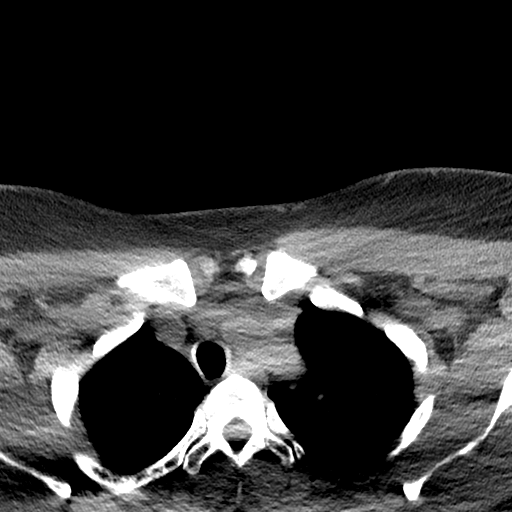
[im 15/100  bone]
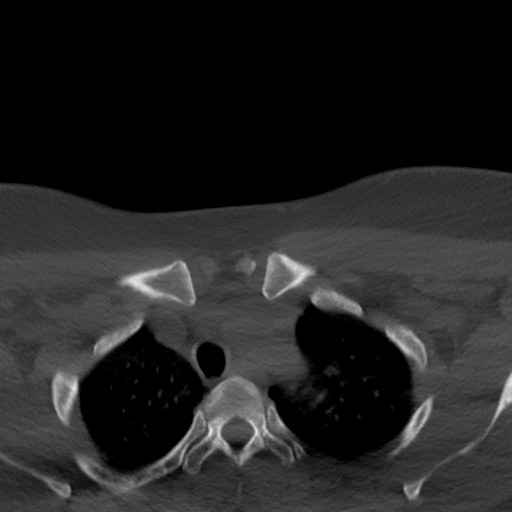
[im 29/100  bone]
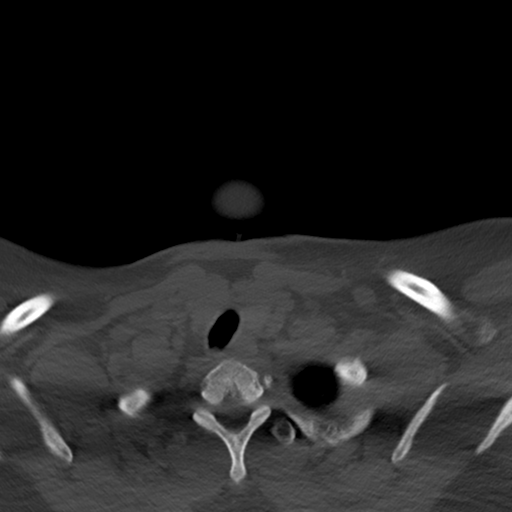
[im 43/100  bone]
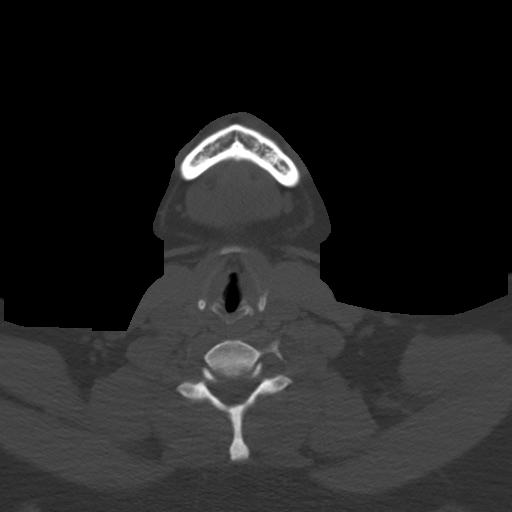
[im 57/100  bone]
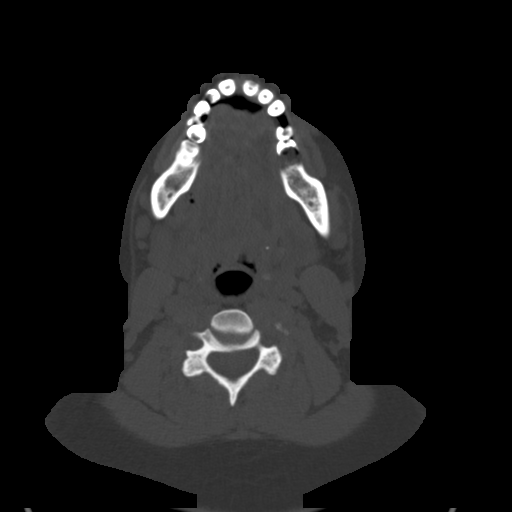
[im 71/100  soft-tissue]
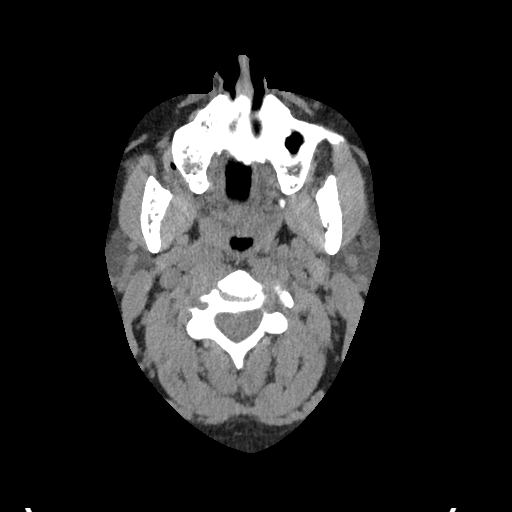
[im 71/100  bone]
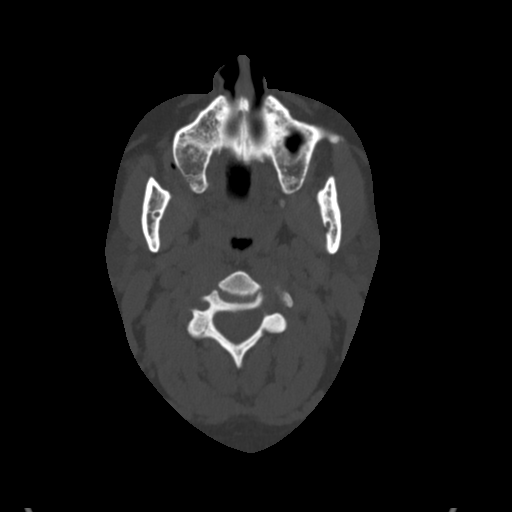
[im 85/100  bone]
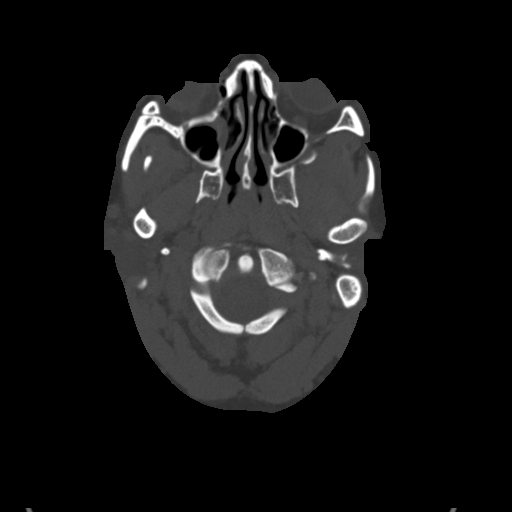

[Series 7: sag neck · sagittal · 0.44mm/px · 5 of 93 slices shown, 6 images]
[im 31/93  bone]
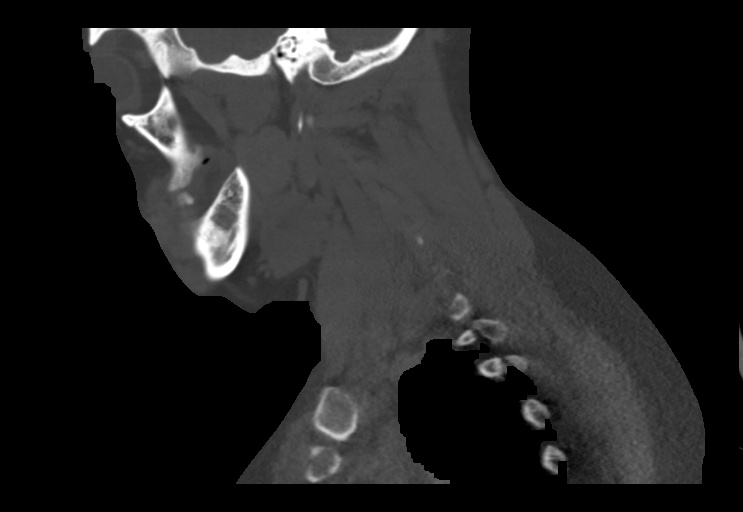
[im 39/93  bone]
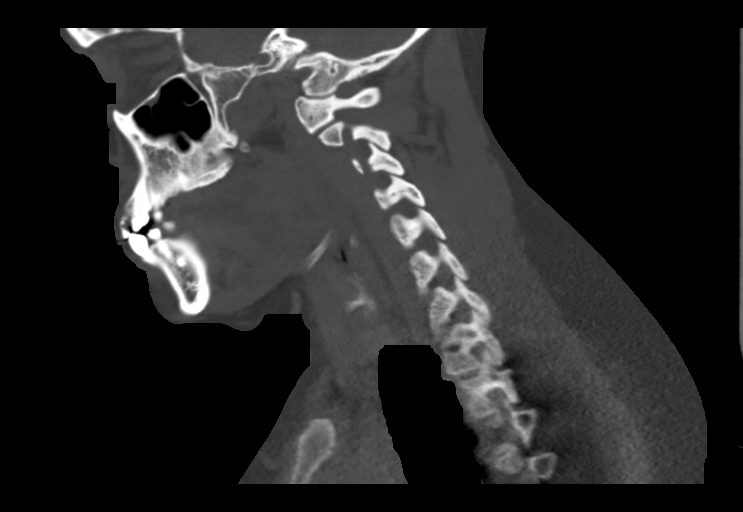
[im 47/93  soft-tissue]
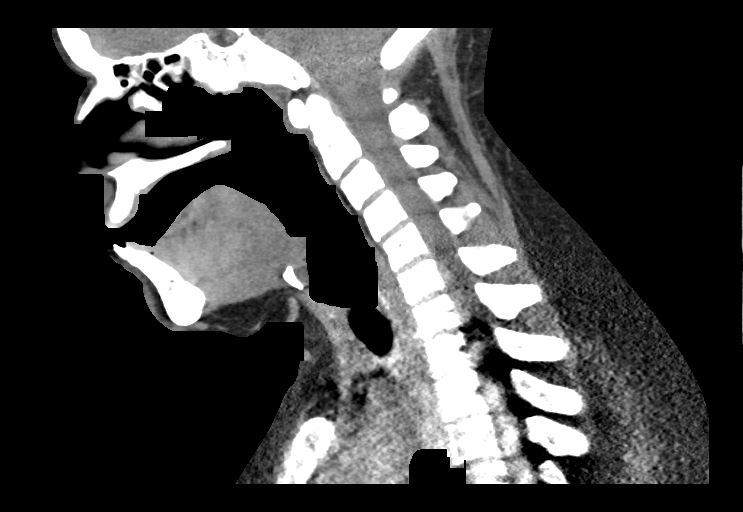
[im 47/93  bone]
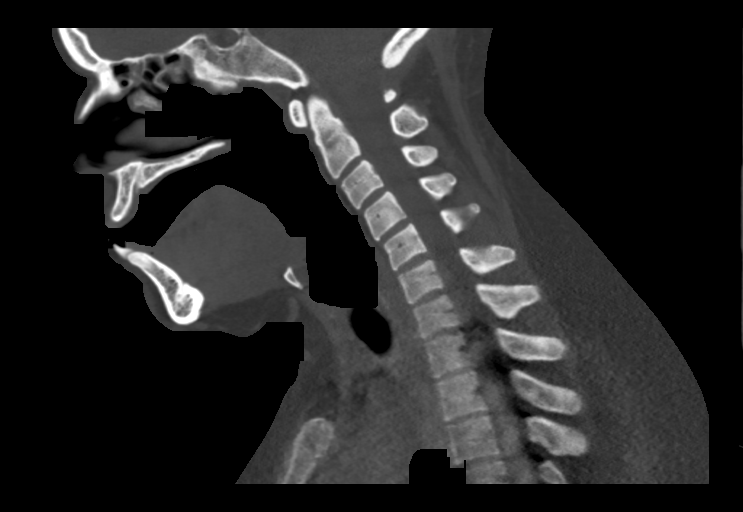
[im 54/93  bone]
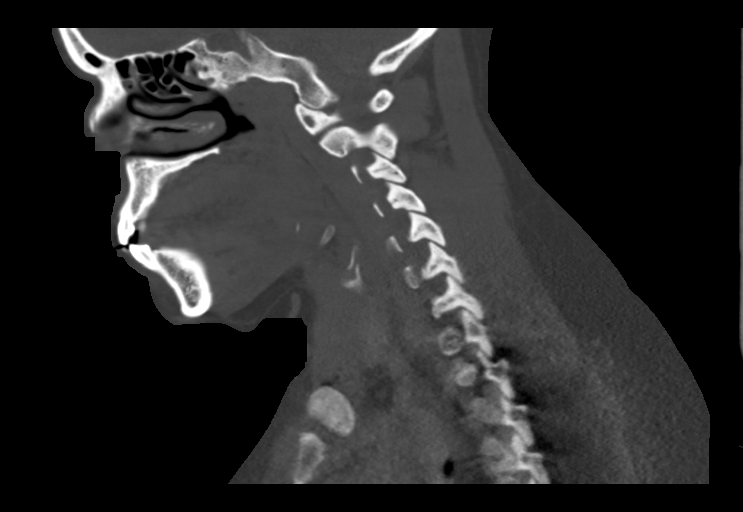
[im 62/93  bone]
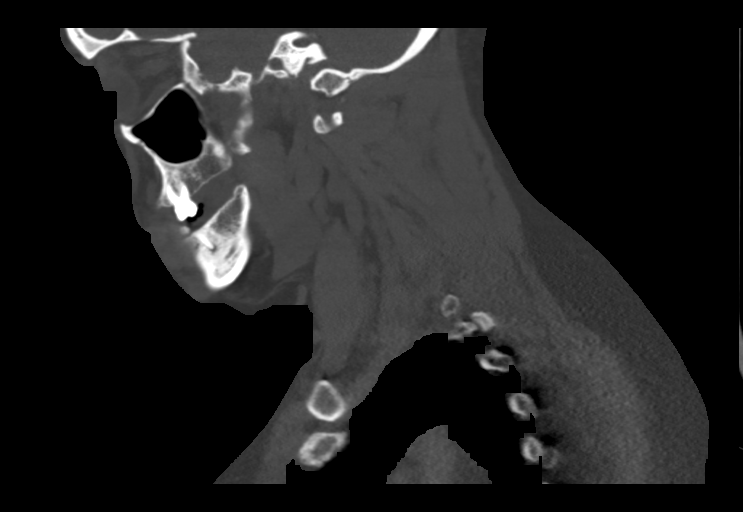

[11 of 27 positions shown; findings below may reference images not displayed]

FINDINGS: Evaluation of the soft tissues is suboptimal in the absence of IV
contrast.

There is significant periodontal disease affecting the mandibular
teeth. Multiple teeth are missing, presumably extracted for
periodontal disease. There is an oral cavity abscess adjacent to the
medial angle of mandible on the RIGHT, 8 x 11 mm cross-section with
a small bubble of air. New new see for instance image 44 series 4.
No definite extension to the submandibular or sublingual space but
characterization is made difficult in the absence of IV contrast.
Reactive BILATERAL RIGHT greater than LEFT level 1 and level 2 lymph
nodes. No mandibular osteomyelitis is detected. No acute sinus
disease. Negative orbits. Airway midline. Negative visualized
intracranial compartment.

No lung apex lesion.  No neck masses.
IMPRESSION: BILATERAL mandibular periodontal disease with a suspected RIGHT oral
cavity abscess 8 x 11 mm cross-section, adjacent to the medial angle
of mandible. Reactive cervical adenopathy.

Within limits for assessment on noncontrast exam, no definite
extension to the submandibular or sublingual space on the RIGHT.
Recommend correlation with physical exam. Oral surgical or ENT
consultation may be warranted.

## 2017-01-08 ENCOUNTER — Emergency Department (HOSPITAL_BASED_OUTPATIENT_CLINIC_OR_DEPARTMENT_OTHER)
Admission: EM | Admit: 2017-01-08 | Discharge: 2017-01-08 | Disposition: A | Discharge status: Home / Self Care | Payer: Self-pay | Attending: Emergency Medicine | Admitting: Emergency Medicine

## 2017-01-08 ENCOUNTER — Encounter (HOSPITAL_BASED_OUTPATIENT_CLINIC_OR_DEPARTMENT_OTHER): Payer: Self-pay | Admitting: Emergency Medicine

## 2017-01-08 DIAGNOSIS — J399 Disease of upper respiratory tract, unspecified: Secondary | ICD-10-CM | POA: Insufficient documentation

## 2017-01-08 DIAGNOSIS — R1110 Vomiting, unspecified: Secondary | ICD-10-CM | POA: Insufficient documentation

## 2017-01-08 DIAGNOSIS — J069 Acute upper respiratory infection, unspecified: Secondary | ICD-10-CM

## 2017-01-08 DIAGNOSIS — F17200 Nicotine dependence, unspecified, uncomplicated: Secondary | ICD-10-CM | POA: Insufficient documentation

## 2017-01-08 DIAGNOSIS — F419 Anxiety disorder, unspecified: Secondary | ICD-10-CM | POA: Insufficient documentation

## 2017-01-08 DIAGNOSIS — I10 Essential (primary) hypertension: Secondary | ICD-10-CM | POA: Insufficient documentation

## 2017-01-08 DIAGNOSIS — J9801 Acute bronchospasm: Secondary | ICD-10-CM | POA: Insufficient documentation

## 2017-01-08 MED ORDER — ONDANSETRON 4 MG PO TBDP: 4.0000 mg | ORAL_TABLET | Freq: Three times a day (TID) | ORAL | 0 refills | Status: DC | PRN

## 2017-01-08 MED ORDER — BENZONATATE 100 MG PO CAPS
100.0000 mg | ORAL_CAPSULE | Freq: Once | ORAL | Status: AC
  Administered 2017-01-08: 100 mg via ORAL
  Filled 2017-01-08: qty 1

## 2017-01-08 MED ORDER — IBUPROFEN 600 MG PO TABS: 600.0000 mg | ORAL_TABLET | Freq: Four times a day (QID) | ORAL | 0 refills | Status: DC | PRN

## 2017-01-08 MED ORDER — ONDANSETRON 4 MG PO TBDP
4.0000 mg | ORAL_TABLET | Freq: Once | ORAL | Status: AC
Start: 2017-01-08 — End: 2017-01-08
  Administered 2017-01-08: 4 mg via ORAL
  Filled 2017-01-08: qty 1

## 2017-01-08 MED ORDER — PROMETHAZINE-DM 6.25-15 MG/5ML PO SYRP: 5.0000 mL | ORAL_SOLUTION | Freq: Four times a day (QID) | ORAL | 0 refills | Status: DC | PRN

## 2017-01-08 MED ORDER — ALBUTEROL SULFATE (2.5 MG/3ML) 0.083% IN NEBU
2.5000 mg | INHALATION_SOLUTION | Freq: Once | RESPIRATORY_TRACT | Status: AC
  Administered 2017-01-08: 2.5 mg via RESPIRATORY_TRACT
  Filled 2017-01-08: qty 3

## 2017-01-08 MED ORDER — AEROCHAMBER PLUS FLO-VU SMALL MISC
1.0000 | Freq: Once | Status: AC
  Administered 2017-01-08: 1
  Filled 2017-01-08: qty 1

## 2017-01-08 MED ORDER — ALBUTEROL SULFATE HFA 108 (90 BASE) MCG/ACT IN AERS
2.0000 | INHALATION_SPRAY | RESPIRATORY_TRACT | Status: DC | PRN
  Administered 2017-01-08: 2 via RESPIRATORY_TRACT
  Filled 2017-01-08: qty 6.7

## 2017-01-08 MED ORDER — IPRATROPIUM-ALBUTEROL 0.5-2.5 (3) MG/3ML IN SOLN
3.0000 mL | Freq: Once | RESPIRATORY_TRACT | Status: AC
  Administered 2017-01-08: 3 mL via RESPIRATORY_TRACT
  Filled 2017-01-08: qty 3

## 2017-01-08 MED ORDER — IBUPROFEN 800 MG PO TABS
800.0000 mg | ORAL_TABLET | Freq: Once | ORAL | Status: AC
  Administered 2017-01-08: 800 mg via ORAL
  Filled 2017-01-08: qty 1

## 2017-01-08 NOTE — Discharge Instructions (Signed)
Please read and follow all provided instructions.  Your diagnoses today include:  1. Upper respiratory tract infection, unspecified type   2. Bronchospasm   3. Post-tussive emesis    Tests performed today include:  Vital signs. See below for your results today.   Medications prescribed:   Tussinex - cough suppressant syrup  You have been prescribed cough suppressant: DO NOT drive or perform any activities that require you to be awake and alert because this medicine can make you drowsy.    Ibuprofen (Motrin, Advil) - anti-inflammatory pain medication  Do not exceed  ibuprofen every 6 hours, take with food  You have been prescribed an anti-inflammatory medication or NSAID. Take with food. Take smallest effective dose for the shortest duration needed for your pain. Stop taking if you experience stomach pain or vomiting.    Zofran (ondansetron) - for nausea and vomiting  Take any prescribed medications only as directed.  Home care instructions:  Follow any educational materials contained in this packet.  Follow-up instructions: Please follow-up with your primary care provider in the next 3 days for further evaluation of your symptoms.   Return instructions:   Please return to the Emergency Department if you experience worsening symptoms.   Please return if you have any other emergent concerns.  Additional Information:  Your vital signs today were: BP 121/82 (BP Location: Right Arm)    Pulse 87    Temp 98.6 F (37 C) (Oral)    Resp (!) 22    Ht  (1.575 m)    Wt 101.2 kg (223 lb)    LMP 01/03/2017    SpO2 100%    BMI 40.79 kg/m  If your blood pressure (BP) was elevated above 135/85 this visit, please have this repeated by your doctor within one month. --------------

## 2017-01-08 NOTE — ED Provider Notes (Signed)
[facility] EMERGENCY DEPARTMENT Provider Note   CSN: [id number] Arrival date & time: 01/08/17  1354     History   Chief Complaint Chief Complaint  Patient presents with  . Shortness of Breath    HPI Terri Rios is a 41 y.o. female.  Patient presents with complaint of wheezing, persistent cough and posttussive emesis. Patient states that she ha been battling upper respiratory symptoms including sinus pressure, nasal congestion and nonproductivecough for approximately one week. Patient saw a doctor 2 days ago who treated her for sinusitis with Augmentin. Her symptoms and vomiting were worse this morning. She vomited approximately 6 times in association with her cough. She does have history of asthma and has an inhaler at home that she has been using sparingly. Otherwise no fevers, other vomiting, diarrhea, urinary symptoms, skin rashes. Patient is been using over-the-counter medications without much relief in her symptoms. No sick contacts.      Past Medical History:  Diagnosis Date  . Anxiety   . Asthma   . Back pain   . Carpal tunnel syndrome of right wrist   . Headache    migraines  . Hypertension   . Kidney stones   . Muscle spasms of neck   . Pneumonia     Patient Active Problem List   Diagnosis Date Noted  . Dental caries 04/04/2015  . Neck pain 03/30/2015    Past Surgical History:  Procedure Laterality Date  . HEMORRHOID SURGERY    . KNEE SURGERY Right   . MULTIPLE EXTRACTIONS WITH ALVEOLOPLASTY Bilateral 04/04/2015   Procedure: EXTRACTION OF TOOTH #'S 19,29, 30 WITH ALVEOLOPLASTY;  Surgeon: Charlynne Pander, DDS;  Location: [facility] OR;  Service: Oral Surgery;  Laterality: Bilateral;    OB History    No data available       Home Medications    Prior to Admission medications   Medication Sig Start Date End Date Taking? Authorizing Provider  albuterol (PROVENTIL HFA;VENTOLIN HFA) 108 (90 Base) MCG/ACT inhaler Inhale 1-2 puffs into the lungs  every 6 (six) hours as needed for wheezing or shortness of breath. 04/26/16   Emi Holes, PA-C  hydrocortisone cream 1 % Apply to affected area 2 times daily 10/18/16   Mesner, Barbara Cower, MD  hydrOXYzine (ATARAX/VISTARIL) 25 MG tablet Take 1 tablet (25 mg total) by mouth every 6 (six) hours as needed for itching. 10/18/16   Mesner, Barbara Cower, MD  ibuprofen (ADVIL,MOTRIN) 600 MG tablet Take 1 tablet (600 mg total) by mouth every 6 (six) hours as needed. 01/08/17   Renne Crigler, PA-C  methocarbamol (ROBAXIN) 500 MG tablet Take 1 tablet (500 mg total) by mouth 2 (two) times daily. 04/26/16   Law, Waylan Boga, PA-C  naproxen (NAPROSYN) 375 MG tablet Take 1 tablet (375 mg total) by mouth 2 (two) times daily. 08/05/16   Palumbo, April, MD  ondansetron (ZOFRAN ODT) 4 MG disintegrating tablet Take 1 tablet (4 mg total) by mouth every 8 (eight) hours as needed for nausea or vomiting. 01/08/17   Renne Crigler, PA-C  promethazine (PHENERGAN) 25 MG tablet Take 1 tablet (25 mg total) by mouth every 6 (six) hours as needed for nausea or vomiting. 09/13/16   Pisciotta, Joni Reining, PA-C  promethazine-dextromethorphan (PROMETHAZINE-DM) 6.25-15 MG/5ML syrup Take 5 mLs by mouth 4 (four) times daily as needed for cough. 01/08/17   Renne Crigler, PA-C    Family History Family History  Problem Relation Age of Onset  . Diabetes Mellitus II Mother   .  Hypertension Mother   . Hypertension Father   . Diabetes Mellitus II Father     Social History Social History  Substance Use Topics  . Smoking status: Current Every Day Smoker    Packs/day: 1.00  . Smokeless tobacco: Never Used  . Alcohol use No     Allergies   Morphine and related and Iodine   Review of Systems Review of Systems  Constitutional: Negative for chills, fatigue and fever.  HENT: Positive for congestion, rhinorrhea and sinus pressure. Negative for ear pain and sore throat.   Eyes: Negative for redness.  Respiratory: Positive for cough, chest tightness  and wheezing. Negative for shortness of breath.   Gastrointestinal: Positive for vomiting. Negative for abdominal pain, diarrhea and nausea.  Genitourinary: Negative for dysuria.  Musculoskeletal: Negative for myalgias and neck stiffness.  Skin: Negative for rash.  Neurological: Positive for headaches.  Hematological: Negative for adenopathy.     Physical Exam Updated Vital Signs BP 121/82 (BP Location: Right Arm)   Pulse 87   Temp 98.6 F (37 C) (Oral)   Resp (!) 22   Ht  (1.575 m)   Wt 101.2 kg (223 lb)   LMP 01/03/2017   SpO2 100%   BMI 40.79 kg/m   Physical Exam  Constitutional: She appears well-developed and well-nourished.  HENT:  Head: Normocephalic and atraumatic.  Right Ear: Tympanic membrane, external ear and ear canal normal.  Left Ear: Tympanic membrane, external ear and ear canal normal.  Nose: Mucosal edema and rhinorrhea present.  Mouth/Throat: Uvula is midline, oropharynx is clear and moist and mucous membranes are normal. Mucous membranes are not dry. No oral lesions. No trismus in the jaw. No uvula swelling. No oropharyngeal exudate, posterior oropharyngeal edema, posterior oropharyngeal erythema or tonsillar abscesses.  Eyes: Conjunctivae are normal. Right eye exhibits no discharge. Left eye exhibits no discharge.  Neck: Normal range of motion. Neck supple.  Cardiovascular: Normal rate, regular rhythm and normal heart sounds.   Pulmonary/Chest: Effort normal and breath sounds normal. No respiratory distress. She has no wheezes. She has no rales.  Frequent coughing during exam.  Abdominal: Soft. There is no tenderness.  Lymphadenopathy:    She has no cervical adenopathy.  Neurological: She is alert.  Skin: Skin is warm and dry.  Psychiatric: She has a normal mood and affect.  Nursing note and vitals reviewed.    ED Treatments / Results  Labs (all labs ordered are listed, but only abnormal results are displayed) Labs Reviewed - No data to  display  EKG  EKG Interpretation None       Radiology No results found.  Procedures Procedures (including critical care time)  Medications Ordered in ED Medications  albuterol (PROVENTIL HFA;VENTOLIN HFA) 108 (90 Base) MCG/ACT inhaler 2 puff (2 puffs Inhalation Given 01/08/17 1529)  ipratropium-albuterol (DUONEB) 0.5-2.5 (3) MG/3ML nebulizer solution 3 mL (3 mLs Nebulization Given 01/08/17 1406)  albuterol (PROVENTIL) (2.5 MG/3ML) 0.083% nebulizer solution 2.5 mg (2.5 mg Nebulization Given 01/08/17 1406)  ibuprofen (ADVIL,MOTRIN) tablet 800 mg (800 mg Oral Given 01/08/17 1454)  benzonatate (TESSALON) capsule 100 mg (100 mg Oral Given 01/08/17 1526)  ondansetron (ZOFRAN-ODT) disintegrating tablet 4 mg (4 mg Oral Given 01/08/17 1526)  AEROCHAMBER PLUS FLO-VU SMALL device MISC 1 each (1 each Other Given 01/08/17 1529)     Initial Impression / Assessment and Plan / ED Course  I have reviewed the triage vital signs and the nursing notes.  Pertinent labs & imaging results that were available  during my care of the patient were reviewed by me and considered in my medical decision making (see chart for details).     Patient seen and examined.   Vital signs reviewed and are as follows: BP 121/82 (BP Location: Right Arm)   Pulse 87   Temp 98.6 F (37 C) (Oral)   Resp (!) 22   Ht  (1.575 m)   Wt 101.2 kg (223 lb)   LMP 01/03/2017   SpO2 100%   BMI 40.79 kg/m   Patient evaluated by myself after she received a breathing treatment. Per RT, patient had expiratory wheezing on arrival. This has completely cleared with her breathing treatment. Patient states that she has had resolution of chest tightness and wheezing.  Patient will be discharged home with symptomatic treatment including cough medication, albuterol inhaler with spacer, Zofran.  Patient counseled on supportive care for viral URI and s/s to return including worsening symptoms, persistent fever, persistent  vomiting, or if they have any other concerns. Urged to see PCP if symptoms persist for more than 3 days. Patient verbalizes understanding and agrees with plan.    Final Clinical Impressions(s) / ED Diagnoses   Final diagnoses:  Upper respiratory tract infection, unspecified type  Bronchospasm  Post-tussive emesis   Patient with symptoms consistent with Sinusitis/bronchitis. Patient is already on treatment with Augmentin. Encouraged scheduled albuterol over the next 1-2 days. Vitals are stable, no fever. No signs of dehydration. Lung exam normal after breathing treatment, no signs of pneumonia. Supportive therapy indicated with return if symptoms worsen.     New Prescriptions Discharge Medication List as of 01/08/2017  3:25 PM    START taking these medications   Details  ibuprofen (ADVIL,MOTRIN) 600 MG tablet Take 1 tablet (600 mg total) by mouth every 6 (six) hours as needed., Starting Tue 01/08/2017, Print    ondansetron (ZOFRAN ODT) 4 MG disintegrating tablet Take 1 tablet (4 mg total) by mouth every 8 (eight) hours as needed for nausea or vomiting., Starting Tue 01/08/2017, Print    promethazine-dextromethorphan (PROMETHAZINE-DM) 6.25-15 MG/5ML syrup Take 5 mLs by mouth 4 (four) times daily as needed for cough., Starting Tue 01/08/2017, Print         Renne Crigler, PA-C 01/08/17 1705    Melene Plan, DO 01/09/17 [phone]

## 2017-01-08 NOTE — ED Triage Notes (Addendum)
Pt c/o SHOB and wheezing that started at work today; started tx for sinus infection 2 days ago; also reports vomited x 6 today, which she thinks was d/t coughing; denies nausea

## 2017-08-13 ENCOUNTER — Ambulatory Visit: Payer: Self-pay | Admitting: Obstetrics & Gynecology

## 2017-08-29 ENCOUNTER — Ambulatory Visit: Payer: BLUE CROSS/BLUE SHIELD | Admitting: Obstetrics & Gynecology

## 2017-08-29 ENCOUNTER — Encounter: Payer: Self-pay | Admitting: Obstetrics & Gynecology

## 2017-08-29 NOTE — Progress Notes (Signed)
   Subjective:    Patient ID: Terri Rios, female    DOB: [birth year], 42 y.o.   MRN: [id number]  HPI 42 yo married P1 90(40 yo son) here today as a new patient to discuss her desire to have a baby. She has not used any contraception for 20+ years. She has periods monthly. She has tried ovulation prediction kits and she IS ovulating. Her husband is the father of her son. [facility]. [facility] and told her that it was normal. She is here for infertility treatment.  Review of Systems     Objective:   Physical Exam        Assessment & Plan:  I have explained that we don't do infertility here and have given her the phone number of Dr. April MansonYalcinkaya Her copay was refunded.
